# Patient Record
Sex: Female | Born: 1960 | Race: White | Hispanic: No | Marital: Married | Smoking: Former smoker
Health system: Southern US, Community
[De-identification: ages and names within clinical notes are randomized; demographics above are authoritative.]

## PROBLEM LIST (undated history)

## (undated) DIAGNOSIS — I10 Essential (primary) hypertension: Secondary | ICD-10-CM

## (undated) DIAGNOSIS — E781 Pure hyperglyceridemia: Secondary | ICD-10-CM

## (undated) DIAGNOSIS — M1990 Unspecified osteoarthritis, unspecified site: Secondary | ICD-10-CM

## (undated) DIAGNOSIS — G709 Myoneural disorder, unspecified: Secondary | ICD-10-CM

## (undated) DIAGNOSIS — K219 Gastro-esophageal reflux disease without esophagitis: Secondary | ICD-10-CM

## (undated) DIAGNOSIS — K649 Unspecified hemorrhoids: Secondary | ICD-10-CM

## (undated) DIAGNOSIS — R7989 Other specified abnormal findings of blood chemistry: Secondary | ICD-10-CM

## (undated) DIAGNOSIS — S62109A Fracture of unspecified carpal bone, unspecified wrist, initial encounter for closed fracture: Secondary | ICD-10-CM

## (undated) DIAGNOSIS — Z789 Other specified health status: Secondary | ICD-10-CM

## (undated) DIAGNOSIS — F909 Attention-deficit hyperactivity disorder, unspecified type: Secondary | ICD-10-CM

## (undated) DIAGNOSIS — M21619 Bunion of unspecified foot: Secondary | ICD-10-CM

## (undated) HISTORY — DX: Pure hyperglyceridemia: E78.1

## (undated) HISTORY — PX: TONSILLECTOMY: SUR1361

## (undated) HISTORY — DX: Unspecified hemorrhoids: K64.9

## (undated) HISTORY — PX: OTHER SURGICAL HISTORY: SHX169

## (undated) HISTORY — PX: RHINOPLASTY: SUR1284

## (undated) HISTORY — DX: Attention-deficit hyperactivity disorder, unspecified type: F90.9

## (undated) HISTORY — PX: SEPTOPLASTY: SUR1290

## (undated) HISTORY — DX: Fracture of unspecified carpal bone, unspecified wrist, initial encounter for closed fracture: S62.109A

## (undated) HISTORY — DX: Other specified abnormal findings of blood chemistry: R79.89

## (undated) HISTORY — PX: HYSTEROSCOPY: SHX211

---

## 2005-05-30 ENCOUNTER — Other Ambulatory Visit: Admission: RE | Admit: 2005-05-30 | Discharge: 2005-05-30 | Payer: Self-pay | Admitting: Family Medicine

## 2007-02-03 ENCOUNTER — Other Ambulatory Visit: Admission: RE | Admit: 2007-02-03 | Discharge: 2007-02-03 | Payer: Self-pay | Admitting: Obstetrics and Gynecology

## 2008-05-19 ENCOUNTER — Other Ambulatory Visit: Admission: RE | Admit: 2008-05-19 | Discharge: 2008-05-19 | Payer: Self-pay | Admitting: Obstetrics and Gynecology

## 2009-07-07 ENCOUNTER — Other Ambulatory Visit: Admission: RE | Admit: 2009-07-07 | Discharge: 2009-07-07 | Payer: Self-pay | Admitting: Obstetrics and Gynecology

## 2011-07-27 ENCOUNTER — Other Ambulatory Visit: Payer: Self-pay | Admitting: Obstetrics and Gynecology

## 2011-07-27 DIAGNOSIS — N644 Mastodynia: Secondary | ICD-10-CM

## 2011-08-03 ENCOUNTER — Other Ambulatory Visit: Payer: Self-pay | Admitting: Obstetrics and Gynecology

## 2011-08-03 ENCOUNTER — Ambulatory Visit
Admission: RE | Admit: 2011-08-03 | Discharge: 2011-08-03 | Disposition: A | Discharge status: Home / Self Care | Payer: BC Managed Care – PPO | Source: Ambulatory Visit | Attending: Obstetrics and Gynecology | Admitting: Obstetrics and Gynecology

## 2011-08-03 DIAGNOSIS — N644 Mastodynia: Secondary | ICD-10-CM

## 2011-12-25 ENCOUNTER — Other Ambulatory Visit (HOSPITAL_COMMUNITY)
Admission: RE | Admit: 2011-12-25 | Discharge: 2011-12-25 | Disposition: A | Discharge status: Home / Self Care | Payer: BC Managed Care – PPO | Source: Ambulatory Visit | Attending: Obstetrics and Gynecology | Admitting: Obstetrics and Gynecology

## 2011-12-25 ENCOUNTER — Other Ambulatory Visit: Payer: Self-pay | Admitting: Obstetrics and Gynecology

## 2011-12-25 DIAGNOSIS — Z01419 Encounter for gynecological examination (general) (routine) without abnormal findings: Secondary | ICD-10-CM | POA: Insufficient documentation

## 2011-12-25 DIAGNOSIS — Z1159 Encounter for screening for other viral diseases: Secondary | ICD-10-CM | POA: Insufficient documentation

## 2011-12-28 ENCOUNTER — Other Ambulatory Visit: Payer: Self-pay | Admitting: Obstetrics and Gynecology

## 2011-12-28 DIAGNOSIS — Z1231 Encounter for screening mammogram for malignant neoplasm of breast: Secondary | ICD-10-CM

## 2012-01-07 ENCOUNTER — Encounter (HOSPITAL_COMMUNITY): Payer: Self-pay | Admitting: Pharmacist

## 2012-01-07 ENCOUNTER — Encounter (HOSPITAL_COMMUNITY): Payer: Self-pay | Admitting: *Deleted

## 2012-01-14 ENCOUNTER — Other Ambulatory Visit: Payer: Self-pay | Admitting: Obstetrics and Gynecology

## 2012-01-16 ENCOUNTER — Ambulatory Visit (HOSPITAL_COMMUNITY): Payer: BC Managed Care – PPO | Admitting: Anesthesiology

## 2012-01-16 ENCOUNTER — Encounter (HOSPITAL_COMMUNITY): Payer: Self-pay | Admitting: *Deleted

## 2012-01-16 ENCOUNTER — Encounter (HOSPITAL_COMMUNITY)
Admission: RE | Disposition: A | Discharge status: Home / Self Care | Payer: Self-pay | Source: Ambulatory Visit | Attending: Obstetrics and Gynecology

## 2012-01-16 ENCOUNTER — Encounter (HOSPITAL_COMMUNITY): Payer: Self-pay | Admitting: Anesthesiology

## 2012-01-16 ENCOUNTER — Ambulatory Visit (HOSPITAL_COMMUNITY)
Admission: RE | Admit: 2012-01-16 | Discharge: 2012-01-16 | Disposition: A | Discharge status: Home / Self Care | Payer: BC Managed Care – PPO | Source: Ambulatory Visit | Attending: Obstetrics and Gynecology | Admitting: Obstetrics and Gynecology

## 2012-01-16 DIAGNOSIS — N920 Excessive and frequent menstruation with regular cycle: Secondary | ICD-10-CM | POA: Diagnosis present

## 2012-01-16 DIAGNOSIS — N926 Irregular menstruation, unspecified: Secondary | ICD-10-CM | POA: Insufficient documentation

## 2012-01-16 DIAGNOSIS — D250 Submucous leiomyoma of uterus: Secondary | ICD-10-CM | POA: Diagnosis present

## 2012-01-16 HISTORY — DX: Other specified health status: Z78.9

## 2012-01-16 LAB — CBC
HCT: 38.5 % (ref 36.0–46.0)
MCV: 85.6 fL (ref 78.0–100.0)
Platelets: 241 10*3/uL (ref 150–400)
RBC: 4.5 MIL/uL (ref 3.87–5.11)
WBC: 8.7 10*3/uL (ref 4.0–10.5)

## 2012-01-16 SURGERY — DILATATION & CURETTAGE/HYSTEROSCOPY WITH HYDROTHERMAL ABLATION
Anesthesia: General | Site: Vagina | Wound class: Clean Contaminated

## 2012-01-16 MED ORDER — HYDROCODONE-ACETAMINOPHEN 5-500 MG PO TABS: ORAL_TABLET | ORAL | Status: DC

## 2012-01-16 MED ORDER — ONDANSETRON HCL 4 MG/2ML IJ SOLN
INTRAMUSCULAR | Status: DC | PRN
  Administered 2012-01-16: 4 mg via INTRAVENOUS

## 2012-01-16 MED ORDER — FENTANYL CITRATE 0.05 MG/ML IJ SOLN
INTRAMUSCULAR | Status: AC
  Filled 2012-01-16: qty 2

## 2012-01-16 MED ORDER — KETOROLAC TROMETHAMINE 30 MG/ML IJ SOLN
INTRAMUSCULAR | Status: AC
  Filled 2012-01-16: qty 1

## 2012-01-16 MED ORDER — DEXAMETHASONE SODIUM PHOSPHATE 10 MG/ML IJ SOLN
INTRAMUSCULAR | Status: DC | PRN
  Administered 2012-01-16: 10 mg via INTRAVENOUS

## 2012-01-16 MED ORDER — MIDAZOLAM HCL 5 MG/5ML IJ SOLN
INTRAMUSCULAR | Status: DC | PRN
  Administered 2012-01-16: 2 mg via INTRAVENOUS

## 2012-01-16 MED ORDER — LACTATED RINGERS IV SOLN
INTRAVENOUS | Status: DC
  Administered 2012-01-16: 13:00:00 via INTRAVENOUS

## 2012-01-16 MED ORDER — LIDOCAINE HCL (CARDIAC) 20 MG/ML IV SOLN
INTRAVENOUS | Status: AC
  Filled 2012-01-16: qty 5

## 2012-01-16 MED ORDER — FENTANYL CITRATE 0.05 MG/ML IJ SOLN
INTRAMUSCULAR | Status: DC | PRN
  Administered 2012-01-16: 100 ug via INTRAVENOUS

## 2012-01-16 MED ORDER — ONDANSETRON HCL 4 MG/2ML IJ SOLN
INTRAMUSCULAR | Status: AC
  Filled 2012-01-16: qty 2

## 2012-01-16 MED ORDER — KETOROLAC TROMETHAMINE 30 MG/ML IJ SOLN
INTRAMUSCULAR | Status: DC | PRN
  Administered 2012-01-16: 30 mg via INTRAVENOUS

## 2012-01-16 MED ORDER — PROPOFOL 10 MG/ML IV EMUL
INTRAVENOUS | Status: AC
  Filled 2012-01-16: qty 20

## 2012-01-16 MED ORDER — SODIUM CHLORIDE 0.9 % IR SOLN
Status: DC | PRN
  Administered 2012-01-16: 3000 mL

## 2012-01-16 MED ORDER — DOXYCYCLINE HYCLATE 50 MG PO CAPS: 100.0000 mg | ORAL_CAPSULE | Freq: Every day | ORAL | Status: AC

## 2012-01-16 MED ORDER — PROPOFOL 10 MG/ML IV EMUL
INTRAVENOUS | Status: DC | PRN
  Administered 2012-01-16: 160 mg via INTRAVENOUS

## 2012-01-16 MED ORDER — EPHEDRINE 5 MG/ML INJ
INTRAVENOUS | Status: AC
  Filled 2012-01-16: qty 10

## 2012-01-16 MED ORDER — LIDOCAINE HCL (CARDIAC) 20 MG/ML IV SOLN
INTRAVENOUS | Status: DC | PRN
  Administered 2012-01-16: 50 mg via INTRAVENOUS

## 2012-01-16 MED ORDER — BUPIVACAINE HCL (PF) 0.25 % IJ SOLN
INTRAMUSCULAR | Status: DC | PRN
  Administered 2012-01-16: 20 mL

## 2012-01-16 MED ORDER — EPHEDRINE SULFATE 50 MG/ML IJ SOLN
INTRAMUSCULAR | Status: DC | PRN
  Administered 2012-01-16 (×3): 10 mg via INTRAVENOUS

## 2012-01-16 MED ORDER — BUPIVACAINE HCL (PF) 0.25 % IJ SOLN
INTRAMUSCULAR | Status: AC
  Filled 2012-01-16: qty 30

## 2012-01-16 MED ORDER — DEXAMETHASONE SODIUM PHOSPHATE 10 MG/ML IJ SOLN
INTRAMUSCULAR | Status: AC
  Filled 2012-01-16: qty 1

## 2012-01-16 MED ORDER — IBUPROFEN 600 MG PO TABS: 600.0000 mg | ORAL_TABLET | Freq: Four times a day (QID) | ORAL | Status: AC | PRN

## 2012-01-16 MED ORDER — FENTANYL CITRATE 0.05 MG/ML IJ SOLN: 25.0000 ug | INTRAMUSCULAR | Status: DC | PRN

## 2012-01-16 MED ORDER — MIDAZOLAM HCL 2 MG/2ML IJ SOLN
INTRAMUSCULAR | Status: AC
  Filled 2012-01-16: qty 2

## 2012-01-16 MED ORDER — SILVER NITRATE-POT NITRATE 75-25 % EX MISC
CUTANEOUS | Status: AC
  Filled 2012-01-16: qty 1

## 2012-01-16 SURGICAL SUPPLY — 17 items
CANISTER SUCTION 2500CC (MISCELLANEOUS) ×2 IMPLANT
CATH ROBINSON RED A/P 16FR (CATHETERS) ×2 IMPLANT
CATH THERMACHOICE III (CATHETERS) IMPLANT
CLOTH BEACON ORANGE TIMEOUT ST (SAFETY) ×2 IMPLANT
CONTAINER PREFILL 10% NBF 60ML (FORM) ×4 IMPLANT
ELECT REM PT RETURN 9FT ADLT (ELECTROSURGICAL)
ELECTRODE REM PT RTRN 9FT ADLT (ELECTROSURGICAL) IMPLANT
GLOVE BIOGEL M 6.5 STRL (GLOVE) ×2 IMPLANT
GLOVE BIOGEL PI IND STRL 6.5 (GLOVE) ×2 IMPLANT
GLOVE BIOGEL PI INDICATOR 6.5 (GLOVE) ×2
GOWN PREVENTION PLUS LG XLONG (DISPOSABLE) ×2 IMPLANT
GOWN STRL REIN XL XLG (GOWN DISPOSABLE) ×2 IMPLANT
PACK HYSTEROSCOPY LF (CUSTOM PROCEDURE TRAY) ×2 IMPLANT
SET GENESYS HTA PROCERVA (MISCELLANEOUS) ×2 IMPLANT
TOWEL OR 17X24 6PK STRL BLUE (TOWEL DISPOSABLE) ×4 IMPLANT
TUBING HYDROFLEX HYSTEROSCOPY (TUBING) ×2 IMPLANT
WATER STERILE IRR 1000ML POUR (IV SOLUTION) ×2 IMPLANT

## 2012-01-16 NOTE — H&P (Signed)
Date of Initial H&P: 12/31/2011  History reviewed, patient examined, no change in status, stable for surgery.

## 2012-01-16 NOTE — Transfer of Care (Signed)
Immediate Anesthesia Transfer of Care Note  Patient: Joyce Lewis  Procedure(s) Performed: Procedure(s) (LRB): DILATATION & CURETTAGE/HYSTEROSCOPY WITH HYDROTHERMAL ABLATION (N/A)  Patient Location: PACU  Anesthesia Type: General  Level of Consciousness: sedated  Airway & Oxygen Therapy: Patient Spontanous Breathing and Patient connected to nasal cannula oxygen  Post-op Assessment: Report given to PACU RN  Post vital signs: Reviewed and stable  Complications: No apparent anesthesia complications

## 2012-01-16 NOTE — Op Note (Signed)
01/16/2012  3:37 PM  PATIENT:  Joyce Lewis  51 y.o. female  PRE-OPERATIVE DIAGNOSIS: 1.  Irregular Menses 2 Menorrhagia   POST-OPERATIVE DIAGNOSIS: 1.  irregular menses 2. Menorrhagia   PROCEDURE:  Procedure(s) (LRB): DILATATION & CURETTAGE/HYSTEROSCOPY WITH HYDROTHERMAL ABLATION (N/A)  SURGEON:  Surgeon(s) and Role:    * Byron Tipping J. Richardson Dopp, MD - Primary  PHYSICIAN ASSISTANT: None   ASSISTANTS: none   ANESTHESIA:   general  EBL:  Total I/O In: 900 [I.V.:900] Out: 60 [Urine:50; Blood:10]  BLOOD ADMINISTERED:none  DRAINS: none   LOCAL MEDICATIONS USED:  MARCAINE     SPECIMEN:  Source of Specimen:  endometrial currettings  DISPOSITION OF SPECIMEN:  PATHOLOGY  COUNTS:  YES  TOURNIQUET:  * No tourniquets in log *  DICTATION: .Dragon Dictation  PLAN OF CARE: Discharge to home after PACU  PATIENT DISPOSITION:  PACU - hemodynamically stable.   Delay start of Pharmacological VTE agent (>24hrs) due to surgical blood loss or risk of bleeding: not applicable  Distention Medium : Normal saline ... Deficit 200 ml   Findings Proliferative appearing endometrium   Procedure: Patient was taken to the operating room where she was placed under general anesthesia. She was placed in the dorsal lithotomy position. She was prepped and draped in the usual sterile fashion. A speculum was placed into the vaginal vault. The anterior lip of the cervix was grasped with a single-tooth tenaculum. Quarter percent Marcaine was injected at the 4 and 8:00 positions of the cervix. The cervix was then sounded to 7.5 cm. The cervix was dilated to approximately 8  mm. HTA hysteroscope was inserted.  The patient had minimal distention of her cavity with the HTA hysteroscope therefore it was removed and a diagnostic scope was inserted with the finding noted above.  The hysteroscope was removed. Sharp curet was introduced and endometrial corrected curettings were obtained. The HTA hysteroscope was then  reinserted.  THe cavity assessment test was performed and passed.  There was no evidence of perforation. The HTA ablation was performed for 10 minutes at 90 Degrees C.... Hysteroscope was then removed. The single-tooth tenaculum was removed from the anterior lip of the cervix. Excellent hemostasis was noted. The speculum was removed from the patient's vagina. She was awakened from anesthesia taken to the room awake and in stable condition. Sponge lap and needle counts were correct x2.

## 2012-01-16 NOTE — Anesthesia Postprocedure Evaluation (Signed)
  Anesthesia Post-op Note  Patient: Joyce Lewis  Procedure(s) Performed: Procedure(s) (LRB): DILATATION & CURETTAGE/HYSTEROSCOPY WITH HYDROTHERMAL ABLATION (N/A)  Patient Location: PACU  Anesthesia Type: General  Level of Consciousness: awake, alert  and oriented  Airway and Oxygen Therapy: Patient Spontanous Breathing  Post-op Pain: none  Post-op Assessment: Post-op Vital signs reviewed, Patient's Cardiovascular Status Stable, Respiratory Function Stable, Patent Airway, No signs of Nausea or vomiting and Pain level controlled  Post-op Vital Signs: Reviewed and stable  Complications: No apparent anesthesia complications

## 2012-01-16 NOTE — Anesthesia Preprocedure Evaluation (Addendum)

## 2012-02-12 ENCOUNTER — Ambulatory Visit
Admission: RE | Admit: 2012-02-12 | Discharge: 2012-02-12 | Disposition: A | Discharge status: Home / Self Care | Payer: BC Managed Care – PPO | Source: Ambulatory Visit | Attending: Obstetrics and Gynecology | Admitting: Obstetrics and Gynecology

## 2012-02-12 DIAGNOSIS — Z1231 Encounter for screening mammogram for malignant neoplasm of breast: Secondary | ICD-10-CM

## 2013-01-05 ENCOUNTER — Other Ambulatory Visit: Payer: Self-pay

## 2013-01-05 DIAGNOSIS — Z1231 Encounter for screening mammogram for malignant neoplasm of breast: Secondary | ICD-10-CM

## 2013-02-17 ENCOUNTER — Ambulatory Visit
Admission: RE | Admit: 2013-02-17 | Discharge: 2013-02-17 | Disposition: A | Discharge status: Home / Self Care | Payer: BC Managed Care – PPO | Source: Ambulatory Visit

## 2013-02-17 DIAGNOSIS — Z1231 Encounter for screening mammogram for malignant neoplasm of breast: Secondary | ICD-10-CM

## 2013-02-24 ENCOUNTER — Other Ambulatory Visit: Payer: Self-pay | Admitting: Obstetrics and Gynecology

## 2013-02-24 ENCOUNTER — Other Ambulatory Visit (HOSPITAL_COMMUNITY)
Admission: RE | Admit: 2013-02-24 | Discharge: 2013-02-24 | Disposition: A | Discharge status: Home / Self Care | Payer: BC Managed Care – PPO | Source: Ambulatory Visit | Attending: Obstetrics and Gynecology | Admitting: Obstetrics and Gynecology

## 2013-02-24 DIAGNOSIS — Z01419 Encounter for gynecological examination (general) (routine) without abnormal findings: Secondary | ICD-10-CM | POA: Insufficient documentation

## 2013-05-15 ENCOUNTER — Telehealth: Payer: Self-pay | Admitting: Family Medicine

## 2013-05-15 ENCOUNTER — Ambulatory Visit (INDEPENDENT_AMBULATORY_CARE_PROVIDER_SITE_OTHER): Payer: BC Managed Care – PPO | Admitting: Family Medicine

## 2013-05-15 ENCOUNTER — Ambulatory Visit: Payer: BC Managed Care – PPO

## 2013-05-15 VITALS — BP 138/82 | HR 78 | Temp 98.2°F | Resp 18

## 2013-05-15 DIAGNOSIS — M25572 Pain in left ankle and joints of left foot: Secondary | ICD-10-CM

## 2013-05-15 DIAGNOSIS — M25579 Pain in unspecified ankle and joints of unspecified foot: Secondary | ICD-10-CM

## 2013-05-15 DIAGNOSIS — S93409A Sprain of unspecified ligament of unspecified ankle, initial encounter: Secondary | ICD-10-CM

## 2013-05-15 MED ORDER — HYDROCODONE-ACETAMINOPHEN 5-325 MG PO TABS: 1.0000 | ORAL_TABLET | Freq: Three times a day (TID) | ORAL | Status: DC | PRN

## 2013-05-15 NOTE — Patient Instructions (Signed)

## 2013-05-15 NOTE — Telephone Encounter (Signed)
Left message for patient to return call. Per [facility] radiologist reviewed XR and confirmed that she does have a fracture.  Stay in CAM walker and use crutches as planned. Recommend recheck in 1 week, sooner if worse

## 2013-05-15 NOTE — Progress Notes (Signed)
  Subjective:    Patient ID: Joyce Lewis, female    DOB: 1960-08-13, 52 y.o.   MRN: [id number]  HPI 52 year old female presents for evaluation of left ankle pain s/p and injury today at 4:00 p.m. She was getting up onto a stage and while pulling herself up rolled her ankle. Admits she head and felt a pop and since then has been unable to bear weight at all.  Admits to extreme pain around her medial malleolus.  Has some numbness as well as swelling and bruising. Is able to move her toes and plantar and dorsiflex.  No hx of previous injuries or surgeries to that foot.  Patient is otherwise doing well with no other concerns today.     Review of Systems  Constitutional: Negative for fever and chills.  Musculoskeletal: Positive for arthralgias, gait problem (unable to bear weight) and joint swelling.  Skin: Negative for wound.  Neurological: Positive for numbness. Negative for weakness.       Objective:   Physical Exam  Constitutional: She is oriented to person, place, and time. She appears well-developed and well-nourished.  HENT:  Head: Normocephalic and atraumatic.  Right Ear: External ear normal.  Left Ear: External ear normal.  Eyes: Conjunctivae are normal.  Neck: Normal range of motion.  Cardiovascular: Normal rate.   Pulmonary/Chest: Effort normal.  Musculoskeletal:       Left ankle: She exhibits swelling and ecchymosis. She exhibits normal range of motion, no deformity and normal pulse. Tenderness. Medial malleolus tenderness found. No lateral malleolus, no AITFL, no head of 5th metatarsal and no proximal fibula tenderness found. Achilles tendon normal.  Neurological: She is alert and oriented to person, place, and time.  Psychiatric: She has a normal mood and affect. Her behavior is normal. Judgment and thought content normal.      [facility] reading (PRIMARY) by  Dr. Patsy Lager as possible avulsion fracture of medial malleolus.      Assessment & Plan:  Pain in joint, ankle and  foot, left - Plan: DG Ankle Complete Left, HYDROcodone-acetaminophen (NORCO) 5-325 MG per tablet  Sprain of ankle, unspecified site  Placed in tall Cam and crutches. Instructed to be non weight bearing through the weekend. If pain persists into next week recommend recheck for repeat x-ray.  Norco 5/325 mg q8hours prn pain Ice for 20 minutes 2-3 times daily. Elevate as much as possible Return for recheck sooner if symptoms seem to be worsening, otherwise plan to re x-ray at end of next week if pain has not significantly improved.

## 2013-05-16 NOTE — Progress Notes (Signed)
Received OR report from her x-rays:  LEFT ANKLE COMPLETE - 3+ VIEW  COMPARISON: None.  FINDINGS:  Four views of left ankle submitted. Ankle mortise is preserved.  There is cortical irregularity at the tip of distal tibia medial  malleolus suspicious for subtle avulsion fracture. Soft tissue  swelling adjacent to medial malleolus.  IMPRESSION:  There is cortical irregularity at the tip of distal tibia medial  malleolus suspicious for subtle avulsion fracture. Soft tissue  swelling adjacent to medial malleolus.   Called her and gave this report.  She will come in to [facility] next week for a recheck and repeat films

## 2013-05-16 NOTE — Telephone Encounter (Signed)
Spoke with patient. She is in quite a bit of pain. Norco helped some but she did not sleep well last night. I told her she does have a fracture and will need to be non-weight bearing until follow up next Friday 11/14.  She is to come in sooner if symptoms seem to be getting worse. Patient understands and will try to rest as much as possible

## 2013-10-15 ENCOUNTER — Other Ambulatory Visit: Payer: Self-pay | Admitting: Gastroenterology

## 2013-10-19 ENCOUNTER — Ambulatory Visit: Payer: BC Managed Care – PPO

## 2013-10-19 ENCOUNTER — Ambulatory Visit (INDEPENDENT_AMBULATORY_CARE_PROVIDER_SITE_OTHER): Payer: BC Managed Care – PPO | Admitting: Family Medicine

## 2013-10-19 VITALS — BP 120/90 | HR 86 | Temp 98.0°F | Resp 16 | Ht 65.0 in | Wt 175.0 lb

## 2013-10-19 DIAGNOSIS — S5290XA Unspecified fracture of unspecified forearm, initial encounter for closed fracture: Secondary | ICD-10-CM

## 2013-10-19 DIAGNOSIS — M25539 Pain in unspecified wrist: Secondary | ICD-10-CM

## 2013-10-19 DIAGNOSIS — M25579 Pain in unspecified ankle and joints of unspecified foot: Secondary | ICD-10-CM

## 2013-10-19 MED ORDER — OXYCODONE-ACETAMINOPHEN 5-325 MG PO TABS: 1.0000 | ORAL_TABLET | Freq: Four times a day (QID) | ORAL | Status: DC | PRN

## 2013-10-19 MED ORDER — NALBUPHINE HCL 10 MG/ML IJ SOLN
10.0000 mg | Freq: Once | INTRAMUSCULAR | Status: AC
Start: 2013-10-19 — End: 2013-10-19
  Administered 2013-10-19: 10 mg via INTRAMUSCULAR

## 2013-10-19 NOTE — Progress Notes (Addendum)
.This chart was scribed for Joyce Haber, MD by Roxan Diesel, Scribe.  This patient was seen in [place] [facility] and the patient's care was started at 5:27 PM.  @[facility] @  Patient ID: Joyce Lewis MRN: [id number], DOB: 1961/01/02, 53 y.o. Date of Encounter: 10/19/2013, 5:26 PM  Primary Physician: Marcha Dutton, RN  Chief Complaint: Fall  HPI: 53 y.o. year old female with history below presents with a right wrist injury sustained in a fall 2 hours ago.  Pt states she was standing on a chair trying to reach for something when she slipped and fell to her right.  She caught herself with her right hand and twisted her right ankle.  Currently she complains of severe pain to her right wrist and moderate pain to her right ankle.  She denies head impact or LOC in the fall.  Works for the [employer]   Past Medical History  Diagnosis Date   No pertinent past medical history      Home Meds: Prior to Admission medications   Medication Sig Start Date End Date Taking? Authorizing Provider  HYDROcodone-acetaminophen (NORCO) 5-325 MG per tablet Take 1 tablet by mouth every 8 (eight) hours as needed. 05/15/13   Collene Leyden, PA-C  HYDROcodone-acetaminophen (VICODIN) 5-500 MG per tablet 1-2 every 6 hrs prn 01/16/12   Maeola Sarah. Landry Mellow, MD    Allergies: No Known Allergies  History   Social History   Marital Status: Married    Spouse Name: N/A    Number of Children: N/A   Years of Education: N/A   Occupational History   Not on file.   Social History Main Topics   Smoking status: Former Smoker    Types: Cigarettes   Smokeless tobacco: Not on file   Alcohol Use: 0.0 oz/week    1-2 drink(s) per week     Comment: rarely   Drug Use: No   Sexual Activity: Yes    Museum/gallery curator: Surgical   Other Topics Concern   Not on file   Social History Narrative   No narrative on file     Review of Systems: Constitutional: negative for chills, fever, night  sweats, weight changes, or fatigue  HEENT: negative for vision changes, hearing loss, congestion, rhinorrhea, ST, epistaxis, or sinus pressure Cardiovascular: negative for chest pain or palpitations Respiratory: negative for hemoptysis, wheezing, shortness of breath, or cough Abdominal: negative for abdominal pain, nausea, vomiting, diarrhea, or constipation Musculoskeletal: positive for right wrist pain and right ankle pain Dermatological: negative for rash Neurologic: negative for headache, dizziness, or syncope All other systems reviewed and are otherwise negative with the exception to those above and in the HPI.   Physical Exam: Blood pressure 120/90, pulse 86, temperature 98 F (36.7 C), temperature source Oral, resp. rate 16, height 5\' 5"  (1.651 m), weight 175 lb (79.379 kg), last menstrual period 10/10/2013, SpO2 98.00%., Body mass index is 29.12 kg/(m^2). General: Well developed, well nourished, in no acute distress. Head: Normocephalic, atraumatic, eyes without discharge, sclera non-icteric, nares are without discharge. Bilateral auditory canals clear, TM's are without perforation, pearly grey and translucent with reflective cone of light bilaterally. Oral cavity moist, posterior pharynx without exudate, erythema, peritonsillar abscess, or post nasal drip.  Neck: Supple. No thyromegaly. Full ROM. No lymphadenopathy. Lungs: Clear bilaterally to auscultation without wheezes, rales, or rhonchi. Breathing is unlabored. Heart: RRR with S1 S2. No murmurs, rubs, or gallops appreciated. Abdomen: Soft, non-tender, non-distended with normoactive bowel sounds. No hepatomegaly. No  rebound/guarding. No obvious abdominal masses. Msk:  Strength and tone normal for age. Extremities/Skin: Right wrist is grossly swollen without deformity. She's tender over the right radius and is unable to flex the wrist or pronate it. Neuro: Alert and oriented X 3.  Gait is antalgic with patient wearing left ankle  brace and having some tenderness with extreme range of motion of the right ankle. CNII-XII grossly in tact. Psych:  Responds to questions appropriately with a normal affect.   Labs: [facility] reading (PRIMARY) by  Dr. Joseph Art  negative right ankle, comminuted right radius fracture which is nondisplaced..    ASSESSMENT AND PLAN:  52 y.o. year old female with   Pain in joint, ankle and foot - Plan: DG Ankle Complete Right, oxyCODONE-acetaminophen (ROXICET) 5-325 MG per tablet  Wrist pain - Plan: DG Wrist Complete Right, oxyCODONE-acetaminophen (ROXICET) 5-325 MG per tablet  Follow up 6 days.  OOW until then  Signed, Joyce Haber, MD 10/19/2013 5:26 PM

## 2014-01-14 ENCOUNTER — Other Ambulatory Visit: Payer: Self-pay

## 2014-01-14 DIAGNOSIS — Z1231 Encounter for screening mammogram for malignant neoplasm of breast: Secondary | ICD-10-CM

## 2014-02-19 ENCOUNTER — Encounter (INDEPENDENT_AMBULATORY_CARE_PROVIDER_SITE_OTHER): Payer: Self-pay

## 2014-02-19 ENCOUNTER — Ambulatory Visit
Admission: RE | Admit: 2014-02-19 | Discharge: 2014-02-19 | Disposition: A | Discharge status: Home / Self Care | Payer: BC Managed Care – PPO | Source: Ambulatory Visit

## 2014-02-19 DIAGNOSIS — Z1231 Encounter for screening mammogram for malignant neoplasm of breast: Secondary | ICD-10-CM

## 2014-03-19 ENCOUNTER — Encounter: Payer: Self-pay | Admitting: *Deleted

## 2014-05-19 ENCOUNTER — Other Ambulatory Visit (HOSPITAL_COMMUNITY)
Admission: RE | Admit: 2014-05-19 | Discharge: 2014-05-19 | Disposition: A | Discharge status: Home / Self Care | Payer: BC Managed Care – PPO | Source: Ambulatory Visit | Attending: Obstetrics and Gynecology | Admitting: Obstetrics and Gynecology

## 2014-05-19 ENCOUNTER — Other Ambulatory Visit: Payer: Self-pay | Admitting: Obstetrics and Gynecology

## 2014-05-19 DIAGNOSIS — Z01419 Encounter for gynecological examination (general) (routine) without abnormal findings: Secondary | ICD-10-CM | POA: Insufficient documentation

## 2014-05-20 LAB — CYTOLOGY - PAP

## 2014-11-04 ENCOUNTER — Ambulatory Visit: Payer: BC Managed Care – PPO | Admitting: Cardiovascular Disease

## 2014-11-29 ENCOUNTER — Ambulatory Visit: Payer: BC Managed Care – PPO | Admitting: Licensed Clinical Social Worker

## 2014-12-01 ENCOUNTER — Ambulatory Visit: Payer: BC Managed Care – PPO | Admitting: Licensed Clinical Social Worker

## 2014-12-02 ENCOUNTER — Ambulatory Visit: Payer: BC Managed Care – PPO | Admitting: Cardiology

## 2014-12-10 ENCOUNTER — Encounter: Payer: Self-pay | Admitting: Cardiology

## 2014-12-17 ENCOUNTER — Ambulatory Visit (INDEPENDENT_AMBULATORY_CARE_PROVIDER_SITE_OTHER): Payer: BC Managed Care – PPO | Admitting: Licensed Clinical Social Worker

## 2014-12-17 DIAGNOSIS — F419 Anxiety disorder, unspecified: Secondary | ICD-10-CM

## 2014-12-31 ENCOUNTER — Ambulatory Visit (INDEPENDENT_AMBULATORY_CARE_PROVIDER_SITE_OTHER): Payer: BC Managed Care – PPO | Admitting: Licensed Clinical Social Worker

## 2014-12-31 DIAGNOSIS — F419 Anxiety disorder, unspecified: Secondary | ICD-10-CM | POA: Diagnosis not present

## 2015-01-13 ENCOUNTER — Ambulatory Visit: Payer: BC Managed Care – PPO | Admitting: Licensed Clinical Social Worker

## 2015-01-21 ENCOUNTER — Other Ambulatory Visit: Payer: Self-pay

## 2015-01-21 ENCOUNTER — Ambulatory Visit (INDEPENDENT_AMBULATORY_CARE_PROVIDER_SITE_OTHER): Payer: BC Managed Care – PPO | Admitting: Licensed Clinical Social Worker

## 2015-01-21 DIAGNOSIS — F419 Anxiety disorder, unspecified: Secondary | ICD-10-CM

## 2015-01-21 DIAGNOSIS — Z1231 Encounter for screening mammogram for malignant neoplasm of breast: Secondary | ICD-10-CM

## 2015-01-26 ENCOUNTER — Ambulatory Visit (INDEPENDENT_AMBULATORY_CARE_PROVIDER_SITE_OTHER): Payer: BC Managed Care – PPO | Admitting: Licensed Clinical Social Worker

## 2015-01-26 DIAGNOSIS — F419 Anxiety disorder, unspecified: Secondary | ICD-10-CM

## 2015-02-09 ENCOUNTER — Ambulatory Visit (INDEPENDENT_AMBULATORY_CARE_PROVIDER_SITE_OTHER): Payer: BC Managed Care – PPO | Admitting: Licensed Clinical Social Worker

## 2015-02-09 DIAGNOSIS — F419 Anxiety disorder, unspecified: Secondary | ICD-10-CM | POA: Diagnosis not present

## 2015-02-21 ENCOUNTER — Ambulatory Visit: Payer: BC Managed Care – PPO | Admitting: Licensed Clinical Social Worker

## 2015-02-21 ENCOUNTER — Ambulatory Visit
Admission: RE | Admit: 2015-02-21 | Discharge: 2015-02-21 | Disposition: A | Discharge status: Home / Self Care | Payer: BC Managed Care – PPO | Source: Ambulatory Visit

## 2015-02-21 DIAGNOSIS — Z1231 Encounter for screening mammogram for malignant neoplasm of breast: Secondary | ICD-10-CM

## 2015-08-15 ENCOUNTER — Encounter: Payer: Self-pay | Admitting: Cardiology

## 2015-08-15 ENCOUNTER — Ambulatory Visit (INDEPENDENT_AMBULATORY_CARE_PROVIDER_SITE_OTHER): Payer: BC Managed Care – PPO | Admitting: Cardiology

## 2015-08-15 VITALS — BP 116/80 | HR 88 | Ht 65.0 in | Wt 158.0 lb

## 2015-08-15 DIAGNOSIS — E785 Hyperlipidemia, unspecified: Secondary | ICD-10-CM

## 2015-08-15 DIAGNOSIS — R0789 Other chest pain: Secondary | ICD-10-CM | POA: Diagnosis not present

## 2015-08-15 DIAGNOSIS — I10 Essential (primary) hypertension: Secondary | ICD-10-CM | POA: Insufficient documentation

## 2015-08-15 NOTE — Progress Notes (Signed)
Cardiology Office Note    Date:  08/15/2015   ID:  Joyce Lewis, DOB 04/01/1961, MRN [id number]  PCP:  Anthoney Harada, MD  Cardiologist:   Candee Furbish, MD     History of Present Illness:  Joyce Lewis is a 55 y.o. female  Here for evaluation of tachycardia at the request of Dr.  Yaakov Guthrie.  It is been a while since we have seen each other. I saw her last on 01/02/10. She is a history of ADHD, anxiety, family history of MI. Heart attack in 76s. She's had a previous stress test which was normal on 12/03/2008. Exercise time was 9 minutes. No ectopy. No EKG changes. Normal blood pressure response. She was previously on red yeast rice. LDL 163. EKG from 09/27/14 shows sinus rhythm without any other abnormalities.  Chest pain walking in mall. Hard to breath when feels chest pain.  Felt like a sharp stabbing pain piercing through to her back. Does not recall having any relation to food. Sometimes this can be nonexertional. Sometimes it can occur during situational stress.  One time at night. Woke husband up.   Otherwise feeling well. 4 episodes last 2 years. Grandfather died MI 38. Other grandfather 32.      Past Medical History  Diagnosis Date  . No pertinent past medical history   . Chest pain   . High triglycerides   . Low serum vitamin D   . Hemorrhoids   . Broken wrist   . ADHD (attention deficit hyperactivity disorder)     Past Surgical History  Procedure Laterality Date  . Tonsillectomy    . Rhinoplasty    . Septoplasty    . Hysteroscopy        Current outpatient prescriptions:  .  amphetamine-dextroamphetamine (ADDERALL XR) 30 MG 24 hr capsule, Take 30 mg by mouth daily., Disp: , Rfl: 0 .  aspirin 81 MG tablet, Take 81 mg by mouth daily., Disp: , Rfl:  .  Calcium Carbonate-Vit D-Min (CALCIUM 1200) 1200-1000 MG-UNIT CHEW, Chew 1 tablet by mouth daily., Disp: , Rfl:  .  Cholecalciferol (VITAMIN D3 PO), Take 2 capsules by mouth daily., Disp: , Rfl:  .   Cyanocobalamin (VITAMIN B 12 PO), Take 1 capsule by mouth daily., Disp: , Rfl:  .  FIBER PO, Take 1 tablet by mouth daily., Disp: , Rfl:  .  losartan-hydrochlorothiazide (HYZAAR) 50-12.5 MG tablet, , Disp: , Rfl:  .  Omega-3 Fatty Acids (FISH OIL) 600 MG CAPS, Take 2 capsules by mouth daily., Disp: , Rfl:  .  Red Yeast Rice Extract (RED YEAST RICE PO), Take 1 tablet by mouth daily., Disp: , Rfl:   Allergies:   Review of patient's allergies indicates no known allergies.   Social History   Social History  . Marital Status: Married    Spouse Name: N/A  . Number of Children: N/A  . Years of Education: N/A   Social History Main Topics  . Smoking status: Former Smoker    Types: Cigarettes  . Smokeless tobacco: None  . Alcohol Use: 0.0 oz/week    1-2 drink(s) per week     Comment: rarely  . Drug Use: No  . Sexual Activity: Yes    Birth Control/ Protection: Surgical   Other Topics Concern  . None   Social History Narrative    Last cig 19.   Family History:  The patient's family history includes Cancer in her father; Depression in her brother and mother;  Diabetes in her maternal grandmother and mother; Heart disease in her father, paternal grandfather, and paternal grandmother; Hyperlipidemia in her brother and father; Hypertension in her brother and mother.   ROS:   Please see the history of present illness.    ROS occasional chest pain, chest pressure All other systems reviewed and are negative.   PHYSICAL EXAM:   VS:  BP 116/80 mmHg  Pulse 88  Ht 5\' 5"  (1.651 m)  Wt 158 lb (71.668 kg)  BMI 26.29 kg/m2   GEN: Well nourished, well developed, in no acute distress HEENT: normal Neck: no JVD, carotid bruits, or masses Cardiac: RRR; no murmurs, rubs, or gallops,no edema  Respiratory:  clear to auscultation bilaterally, normal work of breathing GI: soft, nontender, nondistended, + BS MS: no deformity or atrophy Skin: warm and dry, no rash Neuro:  Alert and Oriented x 3,  Strength and sensation are intact Psych: euthymic mood, full affect  Wt Readings from Last 3 Encounters:  08/15/15 158 lb (71.668 kg)  10/19/13 175 lb (79.379 kg)  01/16/12 165 lb (74.844 kg)      Studies/Labs Reviewed:   EKG:  EKG is ordered today.  The ekg ordered today demonstrates  08/15/15-sinus rhythm , no other abnormalities , no specific change from prior personally viewed   Exercise treadmill test 2010-low risk no ischemia  Recent Labs: No results found for requested labs within last 365 days.   Lipid Panel No results found for: CHOL, TRIG, HDL, CHOLHDL, VLDL, LDLCALC, LDLDIRECT  Additional studies/ records that were reviewed today include:   prior stress test, prior office notes, LDL 163, HDL 56    ASSESSMENT:    1. Atypical chest pain   2. Essential hypertension   3. Hyperlipidemia      PLAN:  In order of problems listed above:  1.  atypical chest pain- occasional chest discomfort 4 times over the last 2 years. One point was intense, sharp stabbing-like pain through to her back. Sometimes they can be brought on by stressful situations. She is concerned because of her family history of myocardial infarction. Back in 2010, she underwent an exercise treadmill test that was reassuring. We will do the same , order exercise table test. 2.  Hyperlipidemia-LDL 163. Continue to work on diet, exercise. If unable to reduce with diet and exercise alone, consider addition of low-dose statin therapy. She is worried about taking this medication or any new medications because of potential side effects. She states that she is very sensitive to medicines. Try a low dose if necessary. 3.  Essential hypertension-under great control with    Medication Adjustments/Labs and Tests Ordered: Current medicines are reviewed at length with the patient today.  Concerns regarding medicines are outlined above.  Medication changes, Labs and Tests ordered today are listed in the Patient  Instructions below. Patient Instructions  Medication Instructions:  The current medical regimen is effective;  continue present plan and medications.  Testing/Procedures: Your physician has requested that you have an exercise tolerance test. For further information please visit [email or web address]. Please also follow instruction sheet, as given.  Follow-Up: Follow up as needed with Dr Marlou Porch.  Thank you for choosing [facility]!!             Signed, Candee Furbish, MD  08/15/2015 12:02 PM    [facility] [place], [place], [place]  [address] Phone: [phone]; Fax: [phone]

## 2015-08-15 NOTE — Patient Instructions (Signed)
Medication Instructions:  The current medical regimen is effective;  continue present plan and medications.  Testing/Procedures: Your physician has requested that you have an exercise tolerance test. For further information please visit [email or web address]. Please also follow instruction sheet, as given.  Follow-Up: Follow up as needed with Dr Marlou Porch.  Thank you for choosing [facility]!!

## 2015-08-23 ENCOUNTER — Encounter: Payer: Self-pay | Admitting: Nurse Practitioner

## 2015-08-23 ENCOUNTER — Ambulatory Visit (INDEPENDENT_AMBULATORY_CARE_PROVIDER_SITE_OTHER): Payer: BC Managed Care – PPO

## 2015-08-23 DIAGNOSIS — I10 Essential (primary) hypertension: Secondary | ICD-10-CM

## 2015-08-23 DIAGNOSIS — R0789 Other chest pain: Secondary | ICD-10-CM

## 2015-08-24 LAB — EXERCISE TOLERANCE TEST
CHL CUP MPHR: 167 {beats}/min
CHL CUP STRESS STAGE 1 DBP: 84 mmHg
CHL CUP STRESS STAGE 1 GRADE: 0 %
CHL CUP STRESS STAGE 1 HR: 88 {beats}/min
CHL CUP STRESS STAGE 1 SBP: 138 mmHg
CHL CUP STRESS STAGE 1 SPEED: 0 mph
CHL CUP STRESS STAGE 2 GRADE: 0 %
CHL CUP STRESS STAGE 3 GRADE: 0 %
CHL CUP STRESS STAGE 3 HR: 89 {beats}/min
CHL CUP STRESS STAGE 3 SPEED: 1 mph
CHL CUP STRESS STAGE 4 DBP: 82 mmHg
CHL CUP STRESS STAGE 4 SBP: 151 mmHg
CHL CUP STRESS STAGE 5 GRADE: 12 %
CHL CUP STRESS STAGE 5 HR: 125 {beats}/min
CHL CUP STRESS STAGE 5 SBP: 157 mmHg
CHL CUP STRESS STAGE 6 DBP: 88 mmHg
CHL CUP STRESS STAGE 6 GRADE: 14 %
CHL CUP STRESS STAGE 6 SBP: 171 mmHg
CHL CUP STRESS STAGE 6 SPEED: 3.4 mph
CHL CUP STRESS STAGE 7 GRADE: 16 %
CHL CUP STRESS STAGE 7 HR: 150 {beats}/min
CHL CUP STRESS STAGE 8 SPEED: 1.5 mph
CHL CUP STRESS STAGE 9 GRADE: 0 %
CHL CUP STRESS STAGE 9 HR: 96 {beats}/min
CHL CUP STRESS STAGE 9 SBP: 152 mmHg
CHL CUP STRESS STAGE 9 SPEED: 0 mph
CHL RATE OF PERCEIVED EXERTION: 15
CSEPED: 9 min
Estimated workload: 10.8 METS
Exercise duration (sec): 30 s
Peak HR: 150 {beats}/min
Percent HR: 89 %
Percent of predicted max HR: 89 %
Rest HR: 81 {beats}/min
Stage 2 HR: 90 {beats}/min
Stage 2 Speed: 1 mph
Stage 4 Grade: 10 %
Stage 4 HR: 108 {beats}/min
Stage 4 Speed: 1.7 mph
Stage 5 DBP: 82 mmHg
Stage 5 Speed: 2.5 mph
Stage 6 HR: 146 {beats}/min
Stage 7 Speed: 4.2 mph
Stage 8 DBP: 83 mmHg
Stage 8 Grade: 0 %
Stage 8 HR: 131 {beats}/min
Stage 8 SBP: 168 mmHg
Stage 9 DBP: 87 mmHg

## 2016-01-20 ENCOUNTER — Other Ambulatory Visit: Payer: Self-pay | Admitting: Obstetrics and Gynecology

## 2016-01-20 DIAGNOSIS — Z1231 Encounter for screening mammogram for malignant neoplasm of breast: Secondary | ICD-10-CM

## 2016-02-22 ENCOUNTER — Ambulatory Visit
Admission: RE | Admit: 2016-02-22 | Discharge: 2016-02-22 | Disposition: A | Discharge status: Home / Self Care | Payer: BC Managed Care – PPO | Source: Ambulatory Visit | Attending: Obstetrics and Gynecology | Admitting: Obstetrics and Gynecology

## 2016-02-22 ENCOUNTER — Ambulatory Visit: Payer: Self-pay

## 2016-02-22 DIAGNOSIS — Z1231 Encounter for screening mammogram for malignant neoplasm of breast: Secondary | ICD-10-CM

## 2016-02-24 ENCOUNTER — Other Ambulatory Visit: Payer: Self-pay | Admitting: Gastroenterology

## 2016-02-24 DIAGNOSIS — R1310 Dysphagia, unspecified: Secondary | ICD-10-CM

## 2016-02-27 ENCOUNTER — Other Ambulatory Visit: Payer: Self-pay

## 2016-03-01 ENCOUNTER — Ambulatory Visit
Admission: RE | Admit: 2016-03-01 | Discharge: 2016-03-01 | Disposition: A | Discharge status: Home / Self Care | Payer: BC Managed Care – PPO | Source: Ambulatory Visit | Attending: Gastroenterology | Admitting: Gastroenterology

## 2016-03-01 DIAGNOSIS — R1310 Dysphagia, unspecified: Secondary | ICD-10-CM

## 2016-09-13 ENCOUNTER — Other Ambulatory Visit: Payer: Self-pay | Admitting: Nurse Practitioner

## 2016-09-13 ENCOUNTER — Other Ambulatory Visit (HOSPITAL_COMMUNITY)
Admission: RE | Admit: 2016-09-13 | Discharge: 2016-09-13 | Disposition: A | Discharge status: Home / Self Care | Payer: BC Managed Care – PPO | Source: Ambulatory Visit | Attending: Nurse Practitioner | Admitting: Nurse Practitioner

## 2016-09-13 DIAGNOSIS — Z01419 Encounter for gynecological examination (general) (routine) without abnormal findings: Secondary | ICD-10-CM | POA: Diagnosis present

## 2016-09-13 DIAGNOSIS — Z1151 Encounter for screening for human papillomavirus (HPV): Secondary | ICD-10-CM | POA: Diagnosis not present

## 2016-09-14 LAB — CYTOLOGY - PAP
DIAGNOSIS: NEGATIVE
HPV: NOT DETECTED

## 2017-01-11 ENCOUNTER — Other Ambulatory Visit: Payer: Self-pay | Admitting: Obstetrics and Gynecology

## 2017-01-11 DIAGNOSIS — Z1231 Encounter for screening mammogram for malignant neoplasm of breast: Secondary | ICD-10-CM

## 2017-02-20 DIAGNOSIS — N3946 Mixed incontinence: Secondary | ICD-10-CM | POA: Insufficient documentation

## 2017-02-20 DIAGNOSIS — N8111 Cystocele, midline: Secondary | ICD-10-CM | POA: Insufficient documentation

## 2017-02-22 ENCOUNTER — Ambulatory Visit
Admission: RE | Admit: 2017-02-22 | Discharge: 2017-02-22 | Disposition: A | Discharge status: Home / Self Care | Payer: BC Managed Care – PPO | Source: Ambulatory Visit | Attending: Obstetrics and Gynecology | Admitting: Obstetrics and Gynecology

## 2017-02-22 DIAGNOSIS — Z1231 Encounter for screening mammogram for malignant neoplasm of breast: Secondary | ICD-10-CM

## 2017-05-20 ENCOUNTER — Encounter (HOSPITAL_COMMUNITY): Payer: Self-pay

## 2017-05-20 NOTE — Progress Notes (Addendum)
BBC:WUGQ, Edwyna Shell, MD  Cardiologist: pt denies  EKG: 05/2017 -requested from [facility] test:08/2015 in EPIC  ECHO: pt denies ever  Cardiac Cath: pt denies ever  Chest x-ray: denies past year

## 2017-05-20 NOTE — Pre-Procedure Instructions (Signed)
Joyce Lewis  05/20/2017      [id number] [place], [place] - [address] [place] [address] Phone: [phone] Fax: [phone]    Your procedure is scheduled on May 23, 2017.  Report to [facility] Admitting at [phone] AM.  Call this number if you have problems the morning of surgery:  [phone]   Remember:  Do not eat food or drink liquids after midnight.  Take these medicines the morning of surgery with A SIP OF WATER (none).   Beginning now, STOP taking any Aspirin (unless otherwise instructed by your surgeon), Aleve, Naproxen, Ibuprofen, Motrin, Advil, Goody's, BC's, all herbal medications, fish oil, and all vitamins  Continue all other medications as instructed by your physician except follow the above medication instructions before surgery  Do not wear jewelry, make-up or nail polish.  Do not wear lotions, powders, or perfumes, or deoderant.  Do not shave 48 hours prior to surgery.    Do not bring valuables to the hospital.  [facility] is not responsible for any belongings or valuables.  Contacts, dentures or bridgework may not be worn into surgery.  Leave your suitcase in the car.  After surgery it may be brought to your room.  For patients admitted to the hospital, discharge time will be determined by your treatment team.  Patients discharged the day of surgery will not be allowed to drive home.   Special instructions:   [facility]- Preparing For Surgery  Before surgery, you can play an important role. Because skin is not sterile, your skin needs to be as free of germs as possible. You can reduce the number of germs on your skin by washing with CHG (chlorahexidine gluconate) Soap before surgery.  CHG is an antiseptic cleaner which kills germs and bonds with the skin to continue killing germs even after washing.  Please do not use if you have an allergy to CHG or antibacterial soaps. If your skin becomes  reddened/irritated stop using the CHG.  Do not shave (including legs and underarms) for at least 48 hours prior to first CHG shower. It is OK to shave your face.  Please follow these instructions carefully.   1. Shower the NIGHT BEFORE SURGERY and the MORNING OF SURGERY with CHG.   2. If you chose to wash your hair, wash your hair first as usual with your normal shampoo.  3. After you shampoo, rinse your hair and body thoroughly to remove the shampoo.  4. Use CHG as you would any other liquid soap. You can apply CHG directly to the skin and wash gently with a scrungie or a clean washcloth.   5. Apply the CHG Soap to your body ONLY FROM THE NECK DOWN.  Do not use on open wounds or open sores. Avoid contact with your eyes, ears, mouth and genitals (private parts). Wash Face and genitals (private parts)  with your normal soap.  6. Wash thoroughly, paying special attention to the area where your surgery will be performed.  7. Thoroughly rinse your body with warm water from the neck down.  8. DO NOT shower/wash with your normal soap after using and rinsing off the CHG Soap.  9. Pat yourself dry with a CLEAN TOWEL.  10. Wear CLEAN PAJAMAS to bed the night before surgery, wear comfortable clothes the morning of surgery  11. Place CLEAN SHEETS on your bed the night of your first shower and DO NOT SLEEP WITH PETS.  Day of Surgery: Do not apply any deodorants/lotions. Please wear clean clothes to the hospital/surgery center.     Please read over the following fact sheets that you were given. Pain Booklet, Coughing and Deep Breathing and Surgical Site Infection Prevention

## 2017-05-21 ENCOUNTER — Other Ambulatory Visit: Payer: Self-pay

## 2017-05-21 ENCOUNTER — Encounter (HOSPITAL_COMMUNITY)
Admission: RE | Admit: 2017-05-21 | Discharge: 2017-05-21 | Disposition: A | Discharge status: Home / Self Care | Payer: BC Managed Care – PPO | Source: Ambulatory Visit | Attending: Orthopedic Surgery | Admitting: Orthopedic Surgery

## 2017-05-21 ENCOUNTER — Encounter (HOSPITAL_COMMUNITY): Payer: Self-pay

## 2017-05-21 DIAGNOSIS — Z79899 Other long term (current) drug therapy: Secondary | ICD-10-CM | POA: Diagnosis not present

## 2017-05-21 DIAGNOSIS — Z87891 Personal history of nicotine dependence: Secondary | ICD-10-CM | POA: Diagnosis not present

## 2017-05-21 DIAGNOSIS — Z8262 Family history of osteoporosis: Secondary | ICD-10-CM | POA: Diagnosis not present

## 2017-05-21 DIAGNOSIS — M4856XA Collapsed vertebra, not elsewhere classified, lumbar region, initial encounter for fracture: Secondary | ICD-10-CM | POA: Diagnosis not present

## 2017-05-21 DIAGNOSIS — Z8249 Family history of ischemic heart disease and other diseases of the circulatory system: Secondary | ICD-10-CM | POA: Diagnosis not present

## 2017-05-21 DIAGNOSIS — M654 Radial styloid tenosynovitis [de Quervain]: Secondary | ICD-10-CM | POA: Diagnosis not present

## 2017-05-21 DIAGNOSIS — M5136 Other intervertebral disc degeneration, lumbar region: Secondary | ICD-10-CM | POA: Diagnosis not present

## 2017-05-21 DIAGNOSIS — M479 Spondylosis, unspecified: Secondary | ICD-10-CM | POA: Diagnosis not present

## 2017-05-21 DIAGNOSIS — Z836 Family history of other diseases of the respiratory system: Secondary | ICD-10-CM | POA: Diagnosis not present

## 2017-05-21 DIAGNOSIS — Z833 Family history of diabetes mellitus: Secondary | ICD-10-CM | POA: Diagnosis not present

## 2017-05-21 DIAGNOSIS — W228XXA Striking against or struck by other objects, initial encounter: Secondary | ICD-10-CM | POA: Diagnosis not present

## 2017-05-21 DIAGNOSIS — Z818 Family history of other mental and behavioral disorders: Secondary | ICD-10-CM | POA: Diagnosis not present

## 2017-05-21 HISTORY — DX: Essential (primary) hypertension: I10

## 2017-05-21 HISTORY — DX: Myoneural disorder, unspecified: G70.9

## 2017-05-21 HISTORY — DX: Unspecified osteoarthritis, unspecified site: M19.90

## 2017-05-21 HISTORY — DX: Gastro-esophageal reflux disease without esophagitis: K21.9

## 2017-05-21 LAB — SURGICAL PCR SCREEN
MRSA, PCR: NEGATIVE
Staphylococcus aureus: NEGATIVE

## 2017-05-21 LAB — CBC
HCT: 37.7 % (ref 36.0–46.0)
Hemoglobin: 13.3 g/dL (ref 12.0–15.0)
MCH: 30.6 pg (ref 26.0–34.0)
MCHC: 35.3 g/dL (ref 30.0–36.0)
MCV: 86.7 fL (ref 78.0–100.0)
PLATELETS: 241 10*3/uL (ref 150–400)
RBC: 4.35 MIL/uL (ref 3.87–5.11)
RDW: 12.4 % (ref 11.5–15.5)
WBC: 7.5 10*3/uL (ref 4.0–10.5)

## 2017-05-21 LAB — BASIC METABOLIC PANEL
Anion gap: 10 (ref 5–15)
BUN: 11 mg/dL (ref 6–20)
CALCIUM: 9.6 mg/dL (ref 8.9–10.3)
CHLORIDE: 104 mmol/L (ref 101–111)
CO2: 22 mmol/L (ref 22–32)
CREATININE: 0.58 mg/dL (ref 0.44–1.00)
GFR calc Af Amer: >60 mL/min (ref >60)
GFR calc non Af Amer: >60 mL/min (ref >60)
GLUCOSE: 94 mg/dL (ref 65–99)
Potassium: 3.4 mmol/L — ABNORMAL LOW (ref 3.5–5.1)
Sodium: 136 mmol/L (ref 135–145)

## 2017-05-22 ENCOUNTER — Encounter (HOSPITAL_COMMUNITY): Payer: Self-pay

## 2017-05-23 ENCOUNTER — Encounter (HOSPITAL_COMMUNITY)
Admission: RE | Disposition: A | Discharge status: Home / Self Care | Payer: Self-pay | Source: Ambulatory Visit | Attending: Orthopedic Surgery

## 2017-05-23 ENCOUNTER — Ambulatory Visit (HOSPITAL_COMMUNITY): Payer: BC Managed Care – PPO

## 2017-05-23 ENCOUNTER — Ambulatory Visit (HOSPITAL_COMMUNITY)
Admission: RE | Admit: 2017-05-23 | Discharge: 2017-05-23 | Disposition: A | Discharge status: Home / Self Care | Payer: BC Managed Care – PPO | Source: Ambulatory Visit | Attending: Orthopedic Surgery | Admitting: Orthopedic Surgery

## 2017-05-23 ENCOUNTER — Ambulatory Visit (HOSPITAL_COMMUNITY): Payer: BC Managed Care – PPO | Admitting: Anesthesiology

## 2017-05-23 ENCOUNTER — Encounter (HOSPITAL_COMMUNITY): Payer: Self-pay | Admitting: *Deleted

## 2017-05-23 ENCOUNTER — Ambulatory Visit (HOSPITAL_COMMUNITY): Payer: BC Managed Care – PPO | Admitting: Emergency Medicine

## 2017-05-23 DIAGNOSIS — W228XXA Striking against or struck by other objects, initial encounter: Secondary | ICD-10-CM | POA: Insufficient documentation

## 2017-05-23 DIAGNOSIS — M4856XA Collapsed vertebra, not elsewhere classified, lumbar region, initial encounter for fracture: Secondary | ICD-10-CM | POA: Insufficient documentation

## 2017-05-23 DIAGNOSIS — Z818 Family history of other mental and behavioral disorders: Secondary | ICD-10-CM | POA: Insufficient documentation

## 2017-05-23 DIAGNOSIS — Z87891 Personal history of nicotine dependence: Secondary | ICD-10-CM | POA: Insufficient documentation

## 2017-05-23 DIAGNOSIS — M654 Radial styloid tenosynovitis [de Quervain]: Secondary | ICD-10-CM | POA: Insufficient documentation

## 2017-05-23 DIAGNOSIS — M5136 Other intervertebral disc degeneration, lumbar region: Secondary | ICD-10-CM | POA: Insufficient documentation

## 2017-05-23 DIAGNOSIS — Z419 Encounter for procedure for purposes other than remedying health state, unspecified: Secondary | ICD-10-CM

## 2017-05-23 DIAGNOSIS — Z8249 Family history of ischemic heart disease and other diseases of the circulatory system: Secondary | ICD-10-CM | POA: Insufficient documentation

## 2017-05-23 DIAGNOSIS — Z79899 Other long term (current) drug therapy: Secondary | ICD-10-CM | POA: Insufficient documentation

## 2017-05-23 DIAGNOSIS — M479 Spondylosis, unspecified: Secondary | ICD-10-CM | POA: Insufficient documentation

## 2017-05-23 DIAGNOSIS — Z8262 Family history of osteoporosis: Secondary | ICD-10-CM | POA: Insufficient documentation

## 2017-05-23 DIAGNOSIS — Z833 Family history of diabetes mellitus: Secondary | ICD-10-CM | POA: Insufficient documentation

## 2017-05-23 DIAGNOSIS — Z836 Family history of other diseases of the respiratory system: Secondary | ICD-10-CM | POA: Insufficient documentation

## 2017-05-23 HISTORY — PX: KYPHOPLASTY: SHX5884

## 2017-05-23 SURGERY — KYPHOPLASTY
Anesthesia: Monitor Anesthesia Care | Site: Back

## 2017-05-23 MED ORDER — IOPAMIDOL (ISOVUE-300) INJECTION 61%
INTRAVENOUS | Status: AC
  Filled 2017-05-23: qty 50

## 2017-05-23 MED ORDER — ONDANSETRON HCL 4 MG/2ML IJ SOLN
INTRAMUSCULAR | Status: AC
  Filled 2017-05-23: qty 2

## 2017-05-23 MED ORDER — OXYCODONE HCL 5 MG PO TABS: 5.0000 mg | ORAL_TABLET | Freq: Once | ORAL | Status: DC | PRN

## 2017-05-23 MED ORDER — BUPIVACAINE-EPINEPHRINE (PF) 0.25% -1:200000 IJ SOLN
INTRAMUSCULAR | Status: AC
  Filled 2017-05-23: qty 30

## 2017-05-23 MED ORDER — BUPIVACAINE-EPINEPHRINE 0.25% -1:200000 IJ SOLN
INTRAMUSCULAR | Status: DC | PRN
  Administered 2017-05-23: 30 mL

## 2017-05-23 MED ORDER — LIDOCAINE HCL (CARDIAC) 20 MG/ML IV SOLN
INTRAVENOUS | Status: DC | PRN
  Administered 2017-05-23: 20 mg via INTRAVENOUS

## 2017-05-23 MED ORDER — BACLOFEN 10 MG PO TABS: 10.0000 mg | ORAL_TABLET | Freq: Three times a day (TID) | ORAL | 1 refills | Status: AC

## 2017-05-23 MED ORDER — IOPAMIDOL (ISOVUE-300) INJECTION 61%
INTRAVENOUS | Status: DC | PRN
  Administered 2017-05-23: 50 mL via INTRAVENOUS

## 2017-05-23 MED ORDER — 0.9 % SODIUM CHLORIDE (POUR BTL) OPTIME
TOPICAL | Status: DC | PRN
  Administered 2017-05-23: 500 mL

## 2017-05-23 MED ORDER — LACTATED RINGERS IV SOLN
INTRAVENOUS | Status: DC
  Administered 2017-05-23 (×2): via INTRAVENOUS

## 2017-05-23 MED ORDER — PROPOFOL 10 MG/ML IV BOLUS
INTRAVENOUS | Status: AC
  Filled 2017-05-23: qty 20

## 2017-05-23 MED ORDER — CEFAZOLIN SODIUM-DEXTROSE 2-4 GM/100ML-% IV SOLN
2.0000 g | INTRAVENOUS | Status: AC
  Administered 2017-05-23: 2 g via INTRAVENOUS
  Filled 2017-05-23: qty 100

## 2017-05-23 MED ORDER — ONDANSETRON HCL 4 MG/2ML IJ SOLN: 4.0000 mg | Freq: Once | INTRAMUSCULAR | Status: DC | PRN

## 2017-05-23 MED ORDER — ONDANSETRON HCL 4 MG/2ML IJ SOLN
INTRAMUSCULAR | Status: DC | PRN
  Administered 2017-05-23: 4 mg via INTRAVENOUS

## 2017-05-23 MED ORDER — PROPOFOL 500 MG/50ML IV EMUL
INTRAVENOUS | Status: DC | PRN
  Administered 2017-05-23: 75 ug/kg/min via INTRAVENOUS

## 2017-05-23 MED ORDER — MIDAZOLAM HCL 2 MG/2ML IJ SOLN
INTRAMUSCULAR | Status: AC
  Filled 2017-05-23: qty 2

## 2017-05-23 MED ORDER — DEXAMETHASONE SODIUM PHOSPHATE 10 MG/ML IJ SOLN
INTRAMUSCULAR | Status: AC
  Filled 2017-05-23: qty 1

## 2017-05-23 MED ORDER — ONDANSETRON 4 MG PO TBDP: 4.0000 mg | ORAL_TABLET | Freq: Three times a day (TID) | ORAL | 0 refills | Status: DC | PRN

## 2017-05-23 MED ORDER — FENTANYL CITRATE (PF) 250 MCG/5ML IJ SOLN
INTRAMUSCULAR | Status: AC
  Filled 2017-05-23: qty 5

## 2017-05-23 MED ORDER — HYDROMORPHONE HCL 1 MG/ML IJ SOLN: 0.2500 mg | INTRAMUSCULAR | Status: DC | PRN

## 2017-05-23 MED ORDER — HYDROCODONE-ACETAMINOPHEN 5-325 MG PO TABS: 1.0000 | ORAL_TABLET | Freq: Four times a day (QID) | ORAL | 0 refills | Status: DC | PRN

## 2017-05-23 MED ORDER — BUPIVACAINE LIPOSOME 1.3 % IJ SUSP
20.0000 mL | INTRAMUSCULAR | Status: AC
  Administered 2017-05-23: 20 mL
  Filled 2017-05-23: qty 20

## 2017-05-23 MED ORDER — FENTANYL CITRATE (PF) 100 MCG/2ML IJ SOLN
INTRAMUSCULAR | Status: DC | PRN
  Administered 2017-05-23 (×3): 50 ug via INTRAVENOUS

## 2017-05-23 MED ORDER — MIDAZOLAM HCL 5 MG/5ML IJ SOLN
INTRAMUSCULAR | Status: DC | PRN
  Administered 2017-05-23: 2 mg via INTRAVENOUS

## 2017-05-23 MED ORDER — LACTATED RINGERS IV SOLN: INTRAVENOUS | Status: DC

## 2017-05-23 MED ORDER — DEXAMETHASONE SODIUM PHOSPHATE 10 MG/ML IJ SOLN
INTRAMUSCULAR | Status: DC | PRN
  Administered 2017-05-23: 10 mg via INTRAVENOUS

## 2017-05-23 MED ORDER — OXYCODONE HCL 5 MG/5ML PO SOLN: 5.0000 mg | Freq: Once | ORAL | Status: DC | PRN

## 2017-05-23 SURGICAL SUPPLY — 47 items
BANDAGE ADH SHEER 1  50/CT (GAUZE/BANDAGES/DRESSINGS) ×2 IMPLANT
BLADE SURG 15 STRL LF DISP TIS (BLADE) ×1 IMPLANT
BLADE SURG 15 STRL SS (BLADE) ×1
BONE FILLER DEVICE STRL SZ3 (INSTRUMENTS) IMPLANT
COVER LIGHT HANDLE  DEROYL (MISCELLANEOUS) ×2 IMPLANT
COVER MAYO STAND STRL (DRAPES) ×2 IMPLANT
CURETTE WEDGE 8.5MM KYPHX (MISCELLANEOUS) ×2 IMPLANT
DRAPE C-ARM 42X72 X-RAY (DRAPES) ×4 IMPLANT
DRAPE HALF SHEET 40X57 (DRAPES) ×4 IMPLANT
DRAPE INCISE IOBAN 66X45 STRL (DRAPES) ×2 IMPLANT
DRAPE LAPAROTOMY T 102X78X121 (DRAPES) ×2 IMPLANT
DRAPE SURG 17X23 STRL (DRAPES) ×2 IMPLANT
DRAPE U-SHAPE 47X51 STRL (DRAPES) ×2 IMPLANT
DRSG AQUACEL AG ADV 3.5X10 (GAUZE/BANDAGES/DRESSINGS) ×2 IMPLANT
DURAPREP 26ML APPLICATOR (WOUND CARE) ×2 IMPLANT
ELECT PENCIL ROCKER SW 15FT (MISCELLANEOUS) ×2 IMPLANT
ELECT REM PT RETURN 9FT ADLT (ELECTROSURGICAL) ×2
ELECTRODE REM PT RTRN 9FT ADLT (ELECTROSURGICAL) ×1 IMPLANT
GAUZE SPONGE 4X4 16PLY XRAY LF (GAUZE/BANDAGES/DRESSINGS) ×2 IMPLANT
GLOVE BIO SURGEON STRL SZ 6.5 (GLOVE) ×2 IMPLANT
GLOVE BIOGEL PI IND STRL 6.5 (GLOVE) ×1 IMPLANT
GLOVE BIOGEL PI IND STRL 8.5 (GLOVE) ×1 IMPLANT
GLOVE BIOGEL PI INDICATOR 6.5 (GLOVE) ×1
GLOVE BIOGEL PI INDICATOR 8.5 (GLOVE) ×1
GLOVE SS BIOGEL STRL SZ 8.5 (GLOVE) ×2 IMPLANT
GLOVE SUPERSENSE BIOGEL SZ 8.5 (GLOVE) ×2
GOWN STRL REUS W/ TWL LRG LVL3 (GOWN DISPOSABLE) ×2 IMPLANT
GOWN STRL REUS W/TWL 2XL LVL3 (GOWN DISPOSABLE) ×2 IMPLANT
GOWN STRL REUS W/TWL LRG LVL3 (GOWN DISPOSABLE) ×2
KIT BASIN OR (CUSTOM PROCEDURE TRAY) ×2 IMPLANT
KIT BIPED VERT BALLN 20MM (Cement) ×2 IMPLANT
KIT ROOM TURNOVER OR (KITS) ×2 IMPLANT
NEEDLE HYPO 25X1 1.5 SAFETY (NEEDLE) ×2 IMPLANT
NEEDLE SPNL 18GX3.5 QUINCKE PK (NEEDLE) ×4 IMPLANT
NS IRRIG 1000ML POUR BTL (IV SOLUTION) ×2 IMPLANT
PACK SURGICAL SETUP 50X90 (CUSTOM PROCEDURE TRAY) ×2 IMPLANT
PACK UNIVERSAL I (CUSTOM PROCEDURE TRAY) ×2 IMPLANT
PAD ARMBOARD 7.5X6 YLW CONV (MISCELLANEOUS) ×4 IMPLANT
SURGIFLO W/THROMBIN 8M KIT (HEMOSTASIS) IMPLANT
SUT BONE WAX W31G (SUTURE) ×2 IMPLANT
SUT MNCRL AB 3-0 PS2 18 (SUTURE) ×2 IMPLANT
SYR CONTROL 10ML LL (SYRINGE) ×2 IMPLANT
TOWEL OR 17X24 6PK STRL BLUE (TOWEL DISPOSABLE) ×2 IMPLANT
TOWEL OR 17X26 10 PK STRL BLUE (TOWEL DISPOSABLE) ×2 IMPLANT
TRAY KYPHOPAK 15/3 ONESTEP 1ST (MISCELLANEOUS) IMPLANT
TRAY KYPHOPAK 20/3 ONESTEP 1ST (MISCELLANEOUS) IMPLANT
WATER STERILE IRR 1000ML POUR (IV SOLUTION) ×2 IMPLANT

## 2017-05-23 NOTE — Op Note (Signed)
Operative report.  Preoperative diagnosis: L1 compression fracture.  Postoperative diagnosis: Same.  Operative procedure.  L1 kyphoplasty.  Complications none.  First Environmental consultant.  [facility] PA.  Indications.  This is a very pleasant 56 year old woman who had significant back pain after a fall.  She is diagnosed with an L1 compression fracture.  Despite TLSO brace immobilization or pain and quality of life continued to suffer.  As a result we elected to proceed with kyphoplasty.  All appropriate risks benefits and alternatives were discussed with the patient and consent was obtained.  Operative note patient brought the operating room.  She was turned prone onto the operating room table.  When she was properly positioned IV sedation was provided back was prepped and draped in a standard fashion.  Timeout was taken confirming patient procedure and all other important data.  Fluoroscopic images were then obtained with 2 fluoroscopy machines one in the lateral the other in the AP plane.  We identified the appropriate starting position and infiltrated this area with quarter percent Marcaine with Exparel.  A small incision was made in the Jamshidi needle was advanced percutaneously down to the lateral aspect of the L1 pedicle.  I advanced the Jamshidi needle into the pedicle of L1.  Once I was nearing the medial wall of the pedicle I confirmed that I was just beyond the posterior wall of the vertebral body on the lateral view.  I then advanced into the vertebral body.  I repeated this exact same procedure on the contralateral side.  Once both pedicles were cannulated I placed the drill and then the inflatable bone tamps.  I inflated them.  I then deflated them and inserted the cement.  I had excellent fill with the cement.  It crossed the midline and supported the compression fracture.  There was no significant leak noted.  Once the cement hardened I removed the trochars to clean the skin.  The skin was then  closed with a 3-0 Monocryl simple stitch Dermabond and a Band-Aid.  Patient was transferred to the PACU without incident.  The end of the case all needle sponge counts were correct.  No adverse intraoperative events.

## 2017-05-23 NOTE — Anesthesia Postprocedure Evaluation (Signed)
Anesthesia Post Note  Patient: Joyce Lewis  Procedure(s) Performed: KYPHOPLASTY L1 (N/A Back)     Patient location during evaluation: PACU Anesthesia Type: MAC Level of consciousness: awake, awake and alert and oriented Pain management: pain level controlled Vital Signs Assessment: post-procedure vital signs reviewed and stable Respiratory status: spontaneous breathing, nonlabored ventilation and respiratory function stable Cardiovascular status: blood pressure returned to baseline Anesthetic complications: no    Last Vitals:  Vitals:   05/23/17 1403 05/23/17 1411  BP: 126/87   Pulse: 78 78  Resp: 18 18  Temp: 36.6 C   SpO2: 98% 98%    Last Pain:  Vitals:   05/23/17 1411  TempSrc:   PainSc: 0-No pain                 Darrelle Barrell COKER

## 2017-05-23 NOTE — Progress Notes (Signed)
Patient ate graham crackers and drank water without any issues. Denies nausea. Patient walked and voided prior to discharge.

## 2017-05-23 NOTE — Anesthesia Preprocedure Evaluation (Signed)
Anesthesia Evaluation  Patient identified by MRN, date of birth, ID band Patient awake    Reviewed: Allergy & Precautions, NPO status , Patient's Chart, lab work & pertinent test results  Airway Mallampati: II  TM Distance: >3 FB Neck ROM: Full    Dental  (+) Teeth Intact, Dental Advisory Given   Pulmonary former smoker,    breath sounds clear to auscultation       Cardiovascular hypertension,  Rhythm:Regular Rate:Normal     Neuro/Psych    GI/Hepatic   Endo/Other    Renal/GU      Musculoskeletal   Abdominal   Peds  Hematology   Anesthesia Other Findings   Reproductive/Obstetrics                             Anesthesia Physical Anesthesia Plan  ASA: II  Anesthesia Plan: General   Post-op Pain Management:    Induction: Intravenous  PONV Risk Score and Plan: 1 and Ondansetron and Dexamethasone  Airway Management Planned: Natural Airway and Simple Face Mask  Additional Equipment:   Intra-op Plan:   Post-operative Plan:   Informed Consent: I have reviewed the patients History and Physical, chart, labs and discussed the procedure including the risks, benefits and alternatives for the proposed anesthesia with the patient or authorized representative who has indicated his/her understanding and acceptance.   Dental advisory given  Plan Discussed with: CRNA and Anesthesiologist  Anesthesia Plan Comments:         Anesthesia Quick Evaluation

## 2017-05-23 NOTE — Progress Notes (Signed)
Per Dr. Rolena Infante patient can be discharged from PACU. No minimum stay needed. Patient instructed to call Dr. Rolena Infante' office in the morning.

## 2017-05-23 NOTE — Transfer of Care (Signed)
Immediate Anesthesia Transfer of Care Note  Patient: Joyce Lewis  Procedure(s) Performed: KYPHOPLASTY L1 (N/A Back)  Patient Location: PACU  Anesthesia Type:MAC  Level of Consciousness: awake, alert , oriented and sedated  Airway & Oxygen Therapy: Patient Spontanous Breathing and Patient connected to nasal cannula oxygen  Post-op Assessment: Report given to RN, Post -op Vital signs reviewed and stable and Patient moving all extremities  Post vital signs: Reviewed and stable  Last Vitals:  Vitals:   05/23/17 1031  BP: 137/90  Pulse: 71  Resp: 19  Temp: 36.7 C  SpO2: 99%    Last Pain:  Vitals:   05/23/17 1031  TempSrc: Oral         Complications: No apparent anesthesia complications

## 2017-05-23 NOTE — Discharge Instructions (Signed)
Balloon Kyphoplasty, Care After °Refer to this sheet in the next few weeks. These instructions provide you with information about caring for yourself after your procedure. Your health care provider may also give you more specific instructions. Your treatment has been planned according to current medical practices, but problems sometimes occur. Call your health care provider if you have any problems or questions after your procedure. °What can I expect after the procedure? °After your procedure, it is common to have back pain. °Follow these instructions at home: °Incision care °· Follow instructions from your health care provider about how to take care of your incisions. Make sure you: °? Wash your hands with soap and water before you change your bandage (dressing). If soap and water are not available, use hand sanitizer. °? Change your dressing as told by your health care provider. °? Leave stitches (sutures), skin glue, or adhesive strips in place. These skin closures may need to be in place for 2 weeks or longer. If adhesive strip edges start to loosen and curl up, you may trim the loose edges. Do not remove adhesive strips completely unless your health care provider tells you to do that. °· Check your incision area every day for signs of infection. Watch for: °? Redness, swelling, or pain. °? Fluid, blood, or pus. °· Keep your dressing dry until your health care provider says that it can be removed. °Activity ° °· Rest your back and avoid intense physical activity for as long as told by your health care provider. °· Return to your normal activities as told by your health care provider. Ask your health care provider what activities are safe for you. °· Do not lift anything that is heavier than 10 lb (4.5 kg). This is about the weight of a gallon of milk. You may need to avoid heavy lifting for several weeks. °General instructions °· Take over-the-counter and prescription medicines only as told by your health care  provider. °· If directed, apply ice to the painful area: °? Put ice in a plastic bag. °? Place a towel between your skin and the bag. °? Leave the ice on for 20 minutes, 2-3 times per day. °· Do not use tobacco products, including cigarettes, chewing tobacco, or e-cigarettes. If you need help quitting, ask your health care provider. °· Keep all follow-up visits as told by your health care provider. This is important. °Contact a health care provider if: °· You have a fever. °· You have redness, swelling, or pain at the site of your incisions. °· You have fluid, blood, or pus coming from your incisions. °· You have pain that gets worse or does not get better with medicine. °· You develop numbness or weakness in any part of your body. °Get help right away if: °· You have chest pain. °· You have difficulty breathing. °· You cannot move your legs. °· You cannot control your bladder or bowel movements. °· You suddenly become weak or numb on one side of your body. °· You become very confused. °· You have trouble speaking or understanding, or both. °This information is not intended to replace advice given to you by your health care provider. Make sure you discuss any questions you have with your health care provider. °Document Released: 03/16/2015 Document Revised: 12/01/2015 Document Reviewed: 10/18/2014 °Elsevier Interactive Patient Education © 2018 [employer] ° °

## 2017-05-23 NOTE — Anesthesia Procedure Notes (Signed)
Procedure Name: MAC Date/Time: 05/23/2017 12:00 PM Performed by: Scheryl Darter, CRNA Pre-anesthesia Checklist: Patient identified, Emergency Drugs available, Patient being monitored and Suction available Patient Re-evaluated:Patient Re-evaluated prior to induction Oxygen Delivery Method: Simple face mask Placement Confirmation: positive ETCO2

## 2017-05-23 NOTE — H&P (Signed)
History of Present Illness The patient is a 56 year old female who presents with back complaints. The patient reports pain ('I still cannot sleep"), tightness, stiffness, numbness (right foot) and tingling involving the L1 Compression Fracture which began 10 week(s) ago following a specific injury (03/08/17 "I fell in a twisted postition to catch the door and I fell down in a twisted position. I had another injury Monday night when I was in the recliner and it flipped over backwards trapping me under it"). The patient describes their pain as sharp ("when I sleep") rating it as 2-3 / 10 on a ten-point analog pain scale ("right now and in the morning, it is the worst when I sleep and turn over. It is a 8-9/10 when I sleep") and feels as if they are unchanged. The symptoms occur constantly. Symptoms are exacerbated by lifting, carrying, bending, flexion, extension, sitting, lying down and walking. The patient indicates that their symptoms are relieved with restricted activity, lying down and TLSO Brace.  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 father and brother Depression  mother Hypertension  grandmother fathers side Cancer  father Osteoporosis  grandmother mothers side Chronic Obstructive Lung Disease  mother Heart Disease   father and grandfather fathers side Diabetes Mellitus  mother  Social History Tobacco use  Former smoker. former smoker Marital status  married Illicit drug use  no Exercise  Exercises daily; does running / walking Pain Contract  no Drug/Alcohol Rehab (Currently)  no Current work status  working full time Alcohol use  current drinker; drinks wine; only occasionally per week Number of flights of stairs before winded  greater than 5 Living situation  live with spouse Drug/Alcohol Rehab (Previously)  no Children  2  Medication History Losartan Potassium (50MG  Tablet, Oral) Active. Amphetamine-Dextroamphet ER (30MG  Capsule ER 24HR, Oral) Active. (1 qd) Medications Reconciled   Note:she returns today for follow-up. Unfortunately she continues to have significant upper lumbar spine pain despite being in the TLSO brace. She has no focal neurological deficits in the lower extremity. No hip, knee, knee and ankle pain with joint range of motion. Normal gait pattern. In the brace she does have a feeling of better support, but still transitioning from sitting to standing or if she stands for long periods of time she generates significant lumbar spine pain. No shortness of breath, chest pain. Abdomen is soft and nontender, no rebound tenderness, no incontinence of bowel and bladder. Lungs clear to auscultation. Heart regular rate and rhythm, no rubs gallops murmurs. She is alert and oriented 3. intact peripheral pulses throughout upper and lower extremities. Compartments in the lower extremity soft and nontender.  X-rays from April 11, 2017 demonstrate an L1 compression fracture with loss of anterior height and loss of normal sagittal alignment of the lumbar spine. Local kyphosis 15.3  X-rays today demonstrate progression of the deformity at L1. L1 kyphosis is now 17.5 (despite TLSO  bracing).  MRI completed on April 16, 2017 demonstrates a mild unhealed what appears  to be benign fracture of L1 consistent with the compression deformity. She does have moderate to severe degenerative disc disease at L4-5 but no high-grade neural compromise. Mild degenerative disease at L5-S1. But there is significant facet arthrosis at L5-S1.  at this point time despite brace immobilization she continues to have significant pain that is limiting her quality of life, mobility, and ability to return to work. X-rays show that there has been fracture progression despite being in the brace.   At this point we spent a great deal of time discussing surgical intervention. We talked about the L1 kyphoplasty and she is watched the animated video of the procedure. Present today for the dictation is the patient and her sister all of their questions and concerns were addressed. We have discussed the risks of surgery: These include infection, bleeding, death, stroke, paralysis, cement leak, ongoing or worse pain, need for additional surgery, additional fractures requiring additional procedures, nerve damage, and CSF leak. the goal of surgery is to reduce her pain, and improve her quality of life. Because of the progression and the significant pain I will try and get this done as soon as possible. All of their questions were addressed. Surgical plan will be to do the L1 kyphoplasty with IV sedation and local anesthesia.  Patient's clinical exam is unchanged from her last office note.  Continues to have significant mid lumbar pain.  No focal neurological deficits on exam.  Given the failure of brace treatment we are moving forward with the L1 kyphoplasty.

## 2017-05-23 NOTE — Brief Op Note (Signed)
05/23/2017  1:36 PM  PATIENT:  Joyce Lewis  56 y.o. female  PRE-OPERATIVE DIAGNOSIS:  L1 Compression Fracture  POST-OPERATIVE DIAGNOSIS:  L1 Compression Fracture  PROCEDURE:  Procedure(s) with comments: KYPHOPLASTY L1 (N/A) - 90 mins  SURGEON:  Surgeon(s) and Role:    Melina Schools, MD - Primary  PHYSICIAN ASSISTANT:   ASSISTANTS: Carmen Mayo   ANESTHESIA:   IV sedation  EBL:  10 mL   BLOOD ADMINISTERED:none  DRAINS: none   LOCAL MEDICATIONS USED:  MARCAINE     SPECIMEN:  No Specimen  DISPOSITION OF SPECIMEN:  N/A  COUNTS:  YES  TOURNIQUET:  * No tourniquets in log *  DICTATION: .Dragon Dictation  PLAN OF CARE: Discharge to home after PACU  PATIENT DISPOSITION:  PACU - hemodynamically stable.

## 2017-05-24 ENCOUNTER — Encounter (HOSPITAL_COMMUNITY): Payer: Self-pay | Admitting: Orthopedic Surgery

## 2017-05-28 NOTE — Discharge Summary (Signed)
Physician Discharge Summary  Patient ID: Joyce Lewis MRN: [id number] DOB/AGE: 1960/09/03 56 y.o.  Admit date: 05/23/2017 Discharge date: 05/23/17  Admission Diagnoses:  L1 compression fracture  Discharge Diagnoses:  L1 compression fracture  Past Medical History:  Diagnosis Date  . ADHD (attention deficit hyperactivity disorder)   . Arthritis   . Broken wrist   . GERD (gastroesophageal reflux disease)   . Hemorrhoids   . High triglycerides   . Hypertension   . Low serum vitamin D   . Neuromuscular disorder ([place])   . No pertinent past medical history     Surgeries: Procedure(s): KYPHOPLASTY L1 on 05/23/2017   Consultants (if any):   Discharged Condition: Improved  Hospital Course: Joyce Lewis is an 56 y.o. female who was admitted 05/23/2017 with a diagnosis of L1 compression fracture and went to the operating room on 05/23/2017 and underwent the above named procedures.  Pt op pt reported mild pain.  Pt woke up expressing a desire to go home the same day.  Pt was observed in PACU.  She was able to eat w/o nausea, ambulate and urinate.    She was given perioperative antibiotics:  Anti-infectives (From admission, onward)   Start     Dose/Rate Route Frequency Ordered Stop   05/23/17 0949  ceFAZolin (ANCEF) IVPB 2g/100 mL premix     2 g 200 mL/hr over 30 Minutes Intravenous 30 min pre-op 05/23/17 0949 05/23/17 1210    .  She was given sequential compression devices, early ambulation, and TED for DVT prophylaxis.  She benefited maximally from the hospital stay and there were no complications.    Recent vital signs:  Vitals:   05/23/17 1403 05/23/17 1411  BP: 126/87   Pulse: 78 78  Resp: 18 18  Temp: 97.9 F (36.6 C)   SpO2: 98% 98%    Recent laboratory studies:  Lab Results  Component Value Date   HGB 13.3 05/21/2017   HGB 13.1 01/16/2012   Lab Results  Component Value Date   WBC 7.5 05/21/2017   PLT 241 05/21/2017   No results found for: INR Lab  Results  Component Value Date   NA 136 05/21/2017   K 3.4 (L) 05/21/2017   CL 104 05/21/2017   CO2 22 05/21/2017   BUN 11 05/21/2017   CREATININE 0.58 05/21/2017   GLUCOSE 94 05/21/2017    Discharge Medications:   Allergies as of 05/23/2017   No Known Allergies     Medication List    TAKE these medications   amphetamine-dextroamphetamine 20 MG 24 hr capsule Commonly known as:  ADDERALL XR Take 40 mg daily by mouth.   baclofen 10 MG tablet Commonly known as:  LIORESAL Take 1 tablet (10 mg total) 3 (three) times daily by mouth.   Biotin 5000 MCG Tabs Take 5,000 mcg daily by mouth.   CALCIUM PO Take 500 mg 2 (two) times daily by mouth.   Fish Oil 1200 MG Caps Take 2,400 mg daily by mouth.   HYDROcodone-acetaminophen 5-325 MG tablet Commonly known as:  NORCO/VICODIN Take 1-2 tablets every 6 (six) hours as needed by mouth for moderate pain.   ibuprofen 200 MG tablet Commonly known as:  ADVIL,MOTRIN Take 600 mg 2 (two) times daily as needed by mouth for moderate pain.   losartan-hydrochlorothiazide 50-12.5 MG tablet Commonly known as:  HYZAAR Take 1 tablet daily by mouth.   ondansetron 4 MG disintegrating tablet Commonly known as:  ZOFRAN ODT Take 1 tablet (4 mg total)  every 8 (eight) hours as needed by mouth for nausea or vomiting.   Red Yeast Rice 600 MG Caps Take 1,200 mg daily by mouth.   Vitamin D 2000 units Caps Take 4,000 Units daily by mouth.       Diagnostic Studies: Dg Lumbar Spine 2-3 Views  Result Date: 05/23/2017 CLINICAL DATA:  56 year old female undergoing lumbar kyphoplasty. EXAM: LUMBAR SPINE - 2-3 VIEW; DG C-ARM 61-120 MIN COMPARISON:  None. FLUOROSCOPY TIME:  2 minutes and 24 seconds FINDINGS: Two intraoperative fluoroscopic spot images of the thoracolumbar junction. There is compression and augmentation with bone cement of the first vertebral body without ribs, likely L1. There is a small volume of extraosseous bone cement along the right  lateral vertebra on the AP view. IMPRESSION: Vertebral body augmentation of L1. Electronically Signed   By: Genevie Ann M.D.   On: 05/23/2017 13:30   Dg C-arm 61-120 Min  Result Date: 05/23/2017 CLINICAL DATA:  56 year old female undergoing lumbar kyphoplasty. EXAM: LUMBAR SPINE - 2-3 VIEW; DG C-ARM 61-120 MIN COMPARISON:  None. FLUOROSCOPY TIME:  2 minutes and 24 seconds FINDINGS: Two intraoperative fluoroscopic spot images of the thoracolumbar junction. There is compression and augmentation with bone cement of the first vertebral body without ribs, likely L1. There is a small volume of extraosseous bone cement along the right lateral vertebra on the AP view. IMPRESSION: Vertebral body augmentation of L1. Electronically Signed   By: Genevie Ann M.D.   On: 05/23/2017 13:30    Disposition: 01-Home or Self Care Pt will present to clinic in 2 weeks Post op medication provided  Discharge Instructions    Incentive spirometry RT   Complete by:  As directed          Signed: Valinda Hoar 05/28/2017, 7:59 AM

## 2018-01-15 ENCOUNTER — Other Ambulatory Visit: Payer: Self-pay | Admitting: Obstetrics and Gynecology

## 2018-01-15 DIAGNOSIS — Z1231 Encounter for screening mammogram for malignant neoplasm of breast: Secondary | ICD-10-CM

## 2018-02-24 ENCOUNTER — Ambulatory Visit
Admission: RE | Admit: 2018-02-24 | Discharge: 2018-02-24 | Disposition: A | Discharge status: Home / Self Care | Payer: BC Managed Care – PPO | Source: Ambulatory Visit | Attending: Obstetrics and Gynecology | Admitting: Obstetrics and Gynecology

## 2018-02-24 DIAGNOSIS — Z1231 Encounter for screening mammogram for malignant neoplasm of breast: Secondary | ICD-10-CM

## 2019-01-19 ENCOUNTER — Other Ambulatory Visit: Payer: Self-pay | Admitting: Obstetrics and Gynecology

## 2019-01-19 DIAGNOSIS — Z1231 Encounter for screening mammogram for malignant neoplasm of breast: Secondary | ICD-10-CM

## 2019-03-04 ENCOUNTER — Ambulatory Visit: Payer: BC Managed Care – PPO

## 2019-03-23 ENCOUNTER — Other Ambulatory Visit: Payer: Self-pay

## 2019-03-23 DIAGNOSIS — Z20822 Contact with and (suspected) exposure to covid-19: Secondary | ICD-10-CM

## 2019-03-24 LAB — NOVEL CORONAVIRUS, NAA: SARS-CoV-2, NAA: NOT DETECTED

## 2019-03-31 ENCOUNTER — Other Ambulatory Visit: Payer: Self-pay

## 2019-03-31 DIAGNOSIS — Z20822 Contact with and (suspected) exposure to covid-19: Secondary | ICD-10-CM

## 2019-04-01 LAB — NOVEL CORONAVIRUS, NAA: SARS-CoV-2, NAA: NOT DETECTED

## 2019-04-06 ENCOUNTER — Ambulatory Visit
Admission: RE | Admit: 2019-04-06 | Discharge: 2019-04-06 | Disposition: A | Discharge status: Home / Self Care | Payer: BC Managed Care – PPO | Source: Ambulatory Visit | Attending: Obstetrics and Gynecology | Admitting: Obstetrics and Gynecology

## 2019-04-06 ENCOUNTER — Other Ambulatory Visit: Payer: Self-pay

## 2019-04-06 DIAGNOSIS — Z1231 Encounter for screening mammogram for malignant neoplasm of breast: Secondary | ICD-10-CM

## 2019-09-05 ENCOUNTER — Ambulatory Visit: Payer: BC Managed Care – PPO | Attending: Internal Medicine

## 2019-09-05 DIAGNOSIS — Z23 Encounter for immunization: Secondary | ICD-10-CM

## 2019-09-05 NOTE — Progress Notes (Signed)
   Covid-19 Vaccination Clinic  Name:  Joyce Lewis    MRN: [id number] DOB: [birth year]  09/05/2019  Joyce Lewis was observed post Covid-19 immunization for 15 minutes without incidence. She was provided with Vaccine Information Sheet and instruction to access the V-Safe system.   Joyce Lewis was instructed to call 911 with any severe reactions post vaccine: Marland Kitchen Difficulty breathing  . Swelling of your face and throat  . A fast heartbeat  . A bad rash all over your body  . Dizziness and weakness    Immunizations Administered    Name Date Dose VIS Date Route   Pfizer COVID-19 Vaccine 09/05/2019 12:31 PM 0.3 mL 06/19/2019 Intramuscular   Manufacturer: [place]   Lot: [id number]   [place]: [id number]

## 2019-09-26 ENCOUNTER — Ambulatory Visit: Payer: Self-pay

## 2019-09-26 ENCOUNTER — Ambulatory Visit: Payer: BC Managed Care – PPO | Attending: Internal Medicine

## 2019-09-26 DIAGNOSIS — Z23 Encounter for immunization: Secondary | ICD-10-CM

## 2019-09-26 NOTE — Progress Notes (Signed)
   Covid-19 Vaccination Clinic  Name:  Joyce Lewis    MRN: [id number] DOB: Mar 11, 1961  09/26/2019  [name] was observed post Covid-19 immunization for 15 minutes without incident. She was provided with Vaccine Information Sheet and instruction to access the V-Safe system.   [name] was instructed to call 911 with any severe reactions post vaccine: Marland Kitchen Difficulty breathing  . Swelling of face and throat  . A fast heartbeat  . A bad rash all over body  . Dizziness and weakness   Immunizations Administered    Name Date Dose VIS Date Route   Pfizer COVID-19 Vaccine 09/26/2019 12:53 PM 0.3 mL 06/19/2019 Intramuscular   Manufacturer: [place]   Lot: [id number]   [place]: [id number]

## 2019-10-06 ENCOUNTER — Ambulatory Visit: Payer: Self-pay

## 2020-02-15 ENCOUNTER — Other Ambulatory Visit: Payer: Self-pay | Admitting: Obstetrics and Gynecology

## 2020-02-15 DIAGNOSIS — Z1231 Encounter for screening mammogram for malignant neoplasm of breast: Secondary | ICD-10-CM

## 2020-05-03 ENCOUNTER — Ambulatory Visit: Payer: BC Managed Care – PPO

## 2020-07-04 ENCOUNTER — Ambulatory Visit: Payer: BC Managed Care – PPO

## 2020-08-15 ENCOUNTER — Ambulatory Visit
Admission: RE | Admit: 2020-08-15 | Discharge: 2020-08-15 | Disposition: A | Discharge status: Home / Self Care | Payer: Self-pay | Source: Ambulatory Visit | Attending: Obstetrics and Gynecology | Admitting: Obstetrics and Gynecology

## 2020-08-15 ENCOUNTER — Other Ambulatory Visit: Payer: Self-pay

## 2020-08-15 DIAGNOSIS — Z1231 Encounter for screening mammogram for malignant neoplasm of breast: Secondary | ICD-10-CM

## 2020-08-29 ENCOUNTER — Ambulatory Visit: Payer: BC Managed Care – PPO

## 2020-10-11 ENCOUNTER — Ambulatory Visit: Payer: BC Managed Care – PPO | Admitting: Podiatry

## 2020-11-01 ENCOUNTER — Ambulatory Visit: Payer: BC Managed Care – PPO | Admitting: Podiatry

## 2020-11-15 ENCOUNTER — Other Ambulatory Visit: Payer: Self-pay

## 2020-11-15 ENCOUNTER — Ambulatory Visit: Payer: BC Managed Care – PPO | Admitting: Podiatry

## 2021-07-17 ENCOUNTER — Other Ambulatory Visit: Payer: Self-pay | Admitting: Obstetrics and Gynecology

## 2021-07-17 DIAGNOSIS — Z1231 Encounter for screening mammogram for malignant neoplasm of breast: Secondary | ICD-10-CM

## 2021-08-28 ENCOUNTER — Ambulatory Visit
Admission: RE | Admit: 2021-08-28 | Discharge: 2021-08-28 | Disposition: A | Discharge status: Home / Self Care | Payer: BC Managed Care – PPO | Source: Ambulatory Visit | Attending: Obstetrics and Gynecology | Admitting: Obstetrics and Gynecology

## 2021-08-28 DIAGNOSIS — Z1231 Encounter for screening mammogram for malignant neoplasm of breast: Secondary | ICD-10-CM

## 2021-12-23 ENCOUNTER — Emergency Department (HOSPITAL_BASED_OUTPATIENT_CLINIC_OR_DEPARTMENT_OTHER): Payer: BC Managed Care – PPO | Admitting: Radiology

## 2021-12-23 ENCOUNTER — Emergency Department (HOSPITAL_BASED_OUTPATIENT_CLINIC_OR_DEPARTMENT_OTHER)
Admission: EM | Admit: 2021-12-23 | Discharge: 2021-12-23 | Disposition: A | Discharge status: Home / Self Care | Payer: BC Managed Care – PPO | Attending: Emergency Medicine | Admitting: Emergency Medicine

## 2021-12-23 ENCOUNTER — Encounter (HOSPITAL_BASED_OUTPATIENT_CLINIC_OR_DEPARTMENT_OTHER): Payer: Self-pay | Admitting: Emergency Medicine

## 2021-12-23 ENCOUNTER — Other Ambulatory Visit: Payer: Self-pay

## 2021-12-23 DIAGNOSIS — W19XXXA Unspecified fall, initial encounter: Secondary | ICD-10-CM | POA: Insufficient documentation

## 2021-12-23 DIAGNOSIS — M79631 Pain in right forearm: Secondary | ICD-10-CM | POA: Insufficient documentation

## 2021-12-23 DIAGNOSIS — M79621 Pain in right upper arm: Secondary | ICD-10-CM | POA: Insufficient documentation

## 2021-12-23 DIAGNOSIS — M25511 Pain in right shoulder: Secondary | ICD-10-CM | POA: Diagnosis present

## 2021-12-23 DIAGNOSIS — Z79899 Other long term (current) drug therapy: Secondary | ICD-10-CM | POA: Diagnosis not present

## 2021-12-23 MED ORDER — IBUPROFEN 200 MG PO TABS
600.0000 mg | ORAL_TABLET | Freq: Once | ORAL | Status: AC
  Administered 2021-12-23: 600 mg via ORAL
  Filled 2021-12-23: qty 1

## 2021-12-23 NOTE — Discharge Instructions (Addendum)
It was a pleasure taking care of you today!   Your xrays were unremarkable today. Attached is information for the on-call sports medicine doctor.  You may take over-the-counter 600 mg ibuprofen every 6 hours or 500 mg Tylenol every 6 hours as needed for pain.  You may apply ice to the affected area for up to 15 minutes at a time, ensure to place a barrier between your skin and the ice.  You may follow-up with your primary care provider as needed.  Return to the ED if you are experiencing increasing/worsening pain, trouble breathing, worsening symptoms.

## 2021-12-23 NOTE — ED Provider Notes (Signed)
[facility] EMERGENCY DEPT Provider Note   CSN: [id number] Arrival date & time: 12/23/21  1720     History  Chief Complaint  Patient presents with   Lytle Michaels    Joyce Lewis is a 61 y.o. female who presents to the emergency department complaining of fall onset prior to arrival.  Patient notes she was at the store and noticed that there was a wet floor to the area which caused her to slip and fall.  Patient has pain to the right shoulder, right upper arm, right forearm.  No meds tried prior to arrival.  Denies hitting her head, LOC, vomiting.   The history is provided by the patient. No language interpreter was used.       Home Medications Prior to Admission medications   Medication Sig Start Date End Date Taking? Authorizing Provider  amphetamine-dextroamphetamine (ADDERALL XR) 20 MG 24 hr capsule Take 40 mg daily by mouth. 04/24/17   [provider]  Biotin 5000 MCG TABS Take 5,000 mcg daily by mouth.    [provider]  CALCIUM PO Take 500 mg 2 (two) times daily by mouth.    [provider]  Cholecalciferol (VITAMIN D) 2000 units CAPS Take 4,000 Units daily by mouth.    [provider]  HYDROcodone-acetaminophen (NORCO/VICODIN) 5-325 MG tablet Take 1-2 tablets every 6 (six) hours as needed by mouth for moderate pain. 05/23/17   Mayo, Darla Lesches, PA-C  ibuprofen (ADVIL,MOTRIN) 200 MG tablet Take 600 mg 2 (two) times daily as needed by mouth for moderate pain.    [provider]  losartan-hydrochlorothiazide (HYZAAR) 50-12.5 MG tablet Take 1 tablet daily by mouth.  07/11/15   [provider]  Omega-3 Fatty Acids (FISH OIL) 1200 MG CAPS Take 2,400 mg daily by mouth.    [provider]  ondansetron (ZOFRAN ODT) 4 MG disintegrating tablet Take 1 tablet (4 mg total) every 8 (eight) hours as needed by mouth for nausea or vomiting. 05/23/17   Mayo, Carmen Christina, PA-C  Red Yeast Rice 600 MG CAPS Take 1,200 mg  daily by mouth.    [provider]      Allergies    Patient has no known allergies.    Review of Systems   Review of Systems  Gastrointestinal:  Negative for nausea and vomiting.  Musculoskeletal:  Positive for arthralgias. Negative for back pain and joint swelling.  Neurological:  Negative for syncope.  All other systems reviewed and are negative.   Physical Exam Updated Vital Signs BP (!) 147/85 (BP Location: Left Arm)   Pulse 83   Temp 97.8 F (36.6 C)   Resp 16   Ht 5' 4.5" (1.638 m)   Wt 77.1 kg   LMP 02/09/2015   SpO2 100%   BMI 28.73 kg/m  Physical Exam Vitals and nursing note reviewed.  Constitutional:      General: She is not in acute distress.    Appearance: She is not diaphoretic.  HENT:     Head: Normocephalic and atraumatic.     Mouth/Throat:     Pharynx: No oropharyngeal exudate.  Eyes:     General: No scleral icterus.    Conjunctiva/sclera: Conjunctivae normal.  Cardiovascular:     Rate and Rhythm: Normal rate and regular rhythm.     Pulses: Normal pulses.     Heart sounds: Normal heart sounds.  Pulmonary:     Effort: Pulmonary effort is normal. No respiratory distress.     Breath sounds: Normal  breath sounds. No wheezing.  Musculoskeletal:        General: Normal range of motion.     Cervical back: Normal range of motion and neck supple.     Comments: Grip strength 5/5 bilaterally.  Decreased active range of motion secondary to pain.  Full passive range of motion.  Sensation intact.  No spinal tenderness to palpation.  No tenderness to palpation noted to musculature of back.  Skin:    General: Skin is warm and dry.  Neurological:     Mental Status: She is alert.  Psychiatric:        Behavior: Behavior normal.     ED Results / Procedures / Treatments   Labs (all labs ordered are listed, but only abnormal results are displayed) Labs Reviewed - No data to display  EKG None  Radiology DG Forearm Right  Result Date:  12/23/2021 CLINICAL DATA:  Fall, right arm pain EXAM: RIGHT FOREARM - 2 VIEW COMPARISON:  None Available. FINDINGS: There is no evidence of fracture or other focal bone lesions. Soft tissues are unremarkable. IMPRESSION: Negative. Electronically Signed   By: Fidela Salisbury M.D.   On: 12/23/2021 19:02   DG Humerus Right  Result Date: 12/23/2021 CLINICAL DATA:  Fall, right arm pain EXAM: RIGHT HUMERUS - 2+ VIEW COMPARISON:  None Available. FINDINGS: There is no evidence of fracture or other focal bone lesions. Soft tissues are unremarkable. IMPRESSION: Negative. Electronically Signed   By: Fidela Salisbury M.D.   On: 12/23/2021 19:02   DG Shoulder Right  Result Date: 12/23/2021 CLINICAL DATA:  Fall, right shoulder pain EXAM: RIGHT SHOULDER - 2+ VIEW COMPARISON:  None Available. FINDINGS: Normal alignment. No acute fracture or dislocation. 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 Limited evaluation of the right hemithorax is unremarkable. 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 Electronically Signed   By: Fidela Salisbury M.D.   On: 12/23/2021 19:01    Procedures Procedures    Medications Ordered in ED Medications  ibuprofen (ADVIL) tablet 600 mg (600 mg Oral Given 12/23/21 2013)    ED Course/ Medical Decision Making/ A&P                            Medical Decision Making Amount and/or Complexity of Data Reviewed Radiology: ordered.   Pt with fall occurring PTA due to slipping on water at a store. Denies hitting their head, LOC, back pain, vomiting. Vital signs stable, patient afebrile, not tachycardic or hypoxic. On exam, patient with grip strength 5/5 bilaterally.  Decreased active range of motion secondary to pain.  Full passive range of motion.  Sensation intact.  No spinal tenderness to palpation.  No cardiovascular respiratory exam findings. Differential diagnosis includes fracture, dislocation, contusion.    Imaging: I ordered  imaging studies including right shoulder, right humerus, right forearm x-ray I independently visualized and interpreted imaging which showed: Negative for acute fractures or dislocations.  Before meals and glenohumeral degenerative arthritis noted on shoulder x-ray I agree with the radiologist interpretation  Medications:  I ordered medication including ibuprofen, ice for symptom management Reevaluation of the patient after these medicines and interventions, I reevaluated the patient and found that they have improved I have reviewed the patients home medicines and have made adjustments as needed    Disposition: Presentation suspicious for musculoskeletal pain status post fall today.  Doubt fracture or dislocation at this time. After consideration of the diagnostic results and the patients response to  treatment, I feel that the patient would benefit from Discharge home.  Supportive care and return precautions discussed with patient and significant other. Return precautions given to the patient including shortness of breath, worsening chest pain, or worsening headache. Appears safe for discharge at this time. Follow up as indicated in discharge paperwork.   This chart was dictated using voice recognition software, Dragon. Despite the best efforts of this provider to proofread and correct errors, errors may still occur which can change documentation meaning.   Final Clinical Impression(s) / ED Diagnoses Final diagnoses:  Fall, initial encounter    Rx / DC Orders ED Discharge Orders     None         Romell Wolden A, PA-C 12/23/21 2021    Fredia Sorrow, MD 01/06/22 [phone]

## 2021-12-23 NOTE — ED Notes (Signed)
Patient given discharge instructions, all questions answered. Patient in possession of all belongings, directed to the discharge area  

## 2021-12-23 NOTE — ED Triage Notes (Signed)
Pt from home with right shoulder and arm and hand pain after a fall today. She slipped while in a store in some water and fell on her right side. Pt states she feels tingling in her left hand. Pt has severe upper arm pain when moving her arm. Pt alert & oriented, nad noted.

## 2022-06-12 ENCOUNTER — Other Ambulatory Visit (HOSPITAL_COMMUNITY): Payer: Self-pay

## 2022-06-12 MED ORDER — WEGOVY 1 MG/0.5ML ~~LOC~~ SOAJ
1.0000 mg | SUBCUTANEOUS | 0 refills | Status: DC
  Filled 2022-06-12 – 2022-06-13 (×2): qty 2, 28d supply, fill #0

## 2022-06-13 ENCOUNTER — Other Ambulatory Visit (HOSPITAL_COMMUNITY): Payer: Self-pay

## 2022-06-23 ENCOUNTER — Other Ambulatory Visit (HOSPITAL_COMMUNITY): Payer: Self-pay

## 2022-07-30 ENCOUNTER — Other Ambulatory Visit: Payer: Self-pay | Admitting: Family Medicine

## 2022-07-30 DIAGNOSIS — Z1231 Encounter for screening mammogram for malignant neoplasm of breast: Secondary | ICD-10-CM

## 2022-09-17 ENCOUNTER — Ambulatory Visit
Admission: RE | Admit: 2022-09-17 | Discharge: 2022-09-17 | Disposition: A | Discharge status: Home / Self Care | Payer: BC Managed Care – PPO | Source: Ambulatory Visit | Attending: Family Medicine | Admitting: Family Medicine

## 2022-09-17 DIAGNOSIS — Z1231 Encounter for screening mammogram for malignant neoplasm of breast: Secondary | ICD-10-CM

## 2022-10-03 ENCOUNTER — Other Ambulatory Visit (HOSPITAL_COMMUNITY): Payer: Self-pay

## 2022-10-03 MED ORDER — WEGOVY 1 MG/0.5ML ~~LOC~~ SOAJ
1.0000 mg | SUBCUTANEOUS | 0 refills | Status: DC
  Filled 2022-10-03: qty 2, 28d supply, fill #0

## 2022-10-05 ENCOUNTER — Other Ambulatory Visit (HOSPITAL_COMMUNITY): Payer: Self-pay

## 2022-12-18 ENCOUNTER — Ambulatory Visit: Payer: BC Managed Care – PPO | Admitting: Podiatry

## 2022-12-18 ENCOUNTER — Ambulatory Visit (INDEPENDENT_AMBULATORY_CARE_PROVIDER_SITE_OTHER): Payer: BC Managed Care – PPO

## 2022-12-18 ENCOUNTER — Encounter: Payer: Self-pay | Admitting: Podiatry

## 2022-12-18 DIAGNOSIS — M2041 Other hammer toe(s) (acquired), right foot: Secondary | ICD-10-CM

## 2022-12-18 NOTE — Progress Notes (Signed)
Subjective:  Patient ID: Joyce Lewis, female    DOB: 1961-03-18,  MRN: [id number] HPI Chief Complaint  Patient presents with   Toe Pain    2nd toe right - hammertoe deformity x years, injury to the toe many years ago and never had it checked at the time, now toe is rubbing against shoes causing a corn   New Patient (Initial Visit)    62 y.o. female presents with the above complaint.   ROS: Denies fever chills nausea vomit muscle aches pains calf pain back pain chest pain shortness of breath.  Past Medical History:  Diagnosis Date   ADHD (attention deficit hyperactivity disorder)    Arthritis    Broken wrist    GERD (gastroesophageal reflux disease)    Hemorrhoids    High triglycerides    Hypertension    Low serum vitamin D    Neuromuscular disorder (HCC)    No pertinent past medical history    Past Surgical History:  Procedure Laterality Date   ablation     HYSTEROSCOPY     KYPHOPLASTY N/A 05/23/2017   Procedure: KYPHOPLASTY L1;  Surgeon: Venita Lick, MD;  Location: [facility] OR;  Service: Orthopedics;  Laterality: N/A;  90 mins   RHINOPLASTY     SEPTOPLASTY     TONSILLECTOMY      Current Outpatient Medications:    amphetamine-dextroamphetamine (ADDERALL XR) 20 MG 24 hr capsule, Take 40 mg daily by mouth., Disp: , Rfl: 0   CALCIUM PO, Take 500 mg 2 (two) times daily by mouth., Disp: , Rfl:    Cholecalciferol (VITAMIN D) 2000 units CAPS, Take 4,000 Units daily by mouth., Disp: , Rfl:    docusate sodium (COLACE) 100 MG capsule, Take 100 mg by mouth daily as needed., Disp: , Rfl:    hydrOXYzine (ATARAX) 25 MG tablet, Take 25 mg by mouth 2 (two) times daily as needed., Disp: , Rfl:    lamoTRIgine (LAMICTAL) 150 MG tablet, Take 150 mg by mouth daily., Disp: , Rfl:    losartan (COZAAR) 100 MG tablet, Take 100 mg by mouth daily., Disp: , Rfl:    Magnesium 400 MG CAPS, Take by mouth., Disp: , Rfl:    metFORMIN (GLUCOPHAGE-XR) 500 MG 24 hr tablet, SMARTSIG:1 Tablet(s) By Mouth  Every Evening, Disp: , Rfl:    OZEMPIC, 1 MG/DOSE, 4 MG/3ML SOPN, Inject into the skin., Disp: , Rfl:    pantoprazole (PROTONIX) 40 MG tablet, Take 40 mg by mouth daily., Disp: , Rfl:   No Known Allergies Review of Systems Objective:  There were no vitals filed for this visit.  General: Well developed, nourished, in no acute distress, alert and oriented x3   Dermatological: Skin is warm, dry and supple bilateral. Nails x 10 are well maintained; remaining integument appears unremarkable at this time. There are no open sores, no preulcerative lesions, no rash or signs of infection present.  Vascular: Dorsalis Pedis artery and Posterior Tibial artery pedal pulses are 2/4 bilateral with immedate capillary fill time. Pedal hair growth present. No varicosities and no lower extremity edema present bilateral.   Neruologic: Grossly intact via light touch bilateral. Vibratory intact via tuning fork bilateral. Protective threshold with Semmes Wienstein monofilament intact to all pedal sites bilateral. Patellar and Achilles deep tendon reflexes 2+ bilateral. No Babinski or clonus noted bilateral.   Musculoskeletal: No gross boney pedal deformities bilateral. No pain, crepitus, or limitation noted with foot and ankle range of motion bilateral. Muscular strength 5/5 in all groups tested  bilateral.  Severe hammertoe deformity rigid at the PIPJ and a flexor contracture.  She also has dorsal contracture at the level of the metatarsophalangeal joint with hallux valgus deformity.  Gait: Unassisted, Nonantalgic.    Radiographs:  Radiographs taken today demonstrate osseously mature individual with mild generalized demineralization of the bones.  She also has metatarsus adductus with what appears to be an increase in the first intermetatarsal angle consistent with bunion deformity.  She also has a rigid hammertoe deformity at the PIPJ secondary to osteoarthritic changes.  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     Joyce Lewis, [place]

## 2022-12-21 ENCOUNTER — Other Ambulatory Visit: Payer: Self-pay | Admitting: Family Medicine

## 2022-12-21 ENCOUNTER — Ambulatory Visit: Payer: BC Managed Care – PPO | Admitting: Podiatry

## 2022-12-21 DIAGNOSIS — Z0000 Encounter for general adult medical examination without abnormal findings: Secondary | ICD-10-CM

## 2023-07-01 ENCOUNTER — Ambulatory Visit (INDEPENDENT_AMBULATORY_CARE_PROVIDER_SITE_OTHER): Payer: BC Managed Care – PPO | Admitting: Otolaryngology

## 2023-07-01 ENCOUNTER — Encounter (INDEPENDENT_AMBULATORY_CARE_PROVIDER_SITE_OTHER): Payer: Self-pay

## 2023-07-01 VITALS — Ht 64.0 in | Wt 155.0 lb

## 2023-07-01 DIAGNOSIS — H9202 Otalgia, left ear: Secondary | ICD-10-CM | POA: Diagnosis not present

## 2023-07-01 DIAGNOSIS — M2669 Other specified disorders of temporomandibular joint: Secondary | ICD-10-CM

## 2023-07-01 MED ORDER — CIPROFLOXACIN-DEXAMETHASONE 0.3-0.1 % OT SUSP: 4.0000 [drp] | Freq: Two times a day (BID) | OTIC | 1 refills | Status: AC

## 2023-07-01 NOTE — Patient Instructions (Signed)
I have ordered an imaging study for you to complete prior to your next visit. Please call [facility] Scheduling at [phone] to schedule your imaging if you have not received a call within 24 hours. If you are unable to complete your imaging study prior to your next scheduled visit please call our office to let us know.

## 2023-07-01 NOTE — Progress Notes (Signed)
Dear Dr. Hart Rochester, Here is my assessment for our mutual patient, Joyce Lewis. Thank you for allowing me the opportunity to care for your patient. Please do not hesitate to contact me should you have any other questions. Sincerely, Dr. Jovita Kussmaul  Otolaryngology Clinic Note Referring provider: Dr. Hart Rochester HPI:  Joyce Lewis is a 62 y.o. female kindly referred by Dr. Hart Rochester for evaluation of left ear pain.   Patient reports: Started at least 4 months ago, no antecedent event. She saw her PCP, prescribed augmentin and it did not help, so referred to ENT. She reports that the pain is still persistent. Comes and goes. Keeps her up at night. Most of the time it is a dull pain, but sometimes sharp. Pain radiates to the head and also gets vertex headaches. She describes the pain more so as "behind the ear" rather than "in the ear." She reports she has "never had problems with the ears."  She denies any nasal symptoms. 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 Sees dentist regularly. No neck pain. No cervical spine pain. No other ENT symptoms. She has tried advil, maybe a little bit. Patient denies: fullness, vertigo, drainage, tinnitus, change in hearing Patient additionally denies: deep pain in ear canal, eustachian tube symptoms such as popping, crackling, sensitivity to pressure changes Patient also denies barotrauma, vestibular suppressant use, ototoxic medication use Prior ear surgery: no  H&N Surgery: no Personal or FHx of bleeding dz or anesthesia difficulty: no  AP/AC: no  PMHx: Pre-DM, GERD, HTN  Tobacco: never.. Occupation: Works at [employer]. Lives in [place], [place]  Independent Review of Additional Tests or Records:  Referral notes Joyce Lewis 05/2023) reviewed and uploaded, available in chart - L AOM/Otalgia, Rx Augmentin; no improvement, referred to ENT  PMH/Meds/All/SocHx/FamHx/ROS:   Past Medical History:  Diagnosis Date   ADHD (attention deficit hyperactivity  disorder)    Arthritis    Broken wrist    GERD (gastroesophageal reflux disease)    Hemorrhoids    High triglycerides    Hypertension    Low serum vitamin D    Neuromuscular disorder (HCC)    No pertinent past medical history      Past Surgical History:  Procedure Laterality Date   ablation     HYSTEROSCOPY     KYPHOPLASTY N/A 05/23/2017   Procedure: KYPHOPLASTY L1;  Surgeon: Venita Lick, MD;  Location: [facility] OR;  Service: Orthopedics;  Laterality: N/A;  90 mins   RHINOPLASTY     SEPTOPLASTY     TONSILLECTOMY      Family History  Problem Relation Age of Onset   Diabetes Mother    Hypertension Mother    Depression Mother    Cancer Father        Esophageal   Heart disease Father    Hyperlipidemia Father    Breast cancer Maternal Aunt    Diabetes Maternal Grandmother    Heart disease Paternal Grandmother    Heart disease Paternal Grandfather    Hypertension Brother    Hyperlipidemia Brother    Depression Brother      Social Connections: Not on file      Current Outpatient Medications:    amphetamine-dextroamphetamine (ADDERALL XR) 20 MG 24 hr capsule, Take 40 mg daily by mouth., Disp: , Rfl: 0   CALCIUM PO, Take 500 mg 2 (two) times daily by mouth., Disp: , Rfl:    Cholecalciferol (VITAMIN D) 2000 units CAPS, Take 4,000 Units daily by mouth., Disp: , Rfl:  ciprofloxacin-dexamethasone (CIPRODEX) OTIC suspension, Place 4 drops into the left ear 2 (two) times daily for 7 days., Disp: 7.5 mL, Rfl: 1   docusate sodium (COLACE) 100 MG capsule, Take 100 mg by mouth daily as needed., Disp: , Rfl:    hydrOXYzine (ATARAX) 25 MG tablet, Take 25 mg by mouth 2 (two) times daily as needed., Disp: , Rfl:    lamoTRIgine (LAMICTAL) 150 MG tablet, Take 150 mg by mouth daily., Disp: , Rfl:    losartan (COZAAR) 100 MG tablet, Take 100 mg by mouth daily., Disp: , Rfl:    Magnesium 400 MG CAPS, Take by mouth., Disp: , Rfl:    metFORMIN (GLUCOPHAGE-XR) 500 MG 24 hr tablet, SMARTSIG:1  Tablet(s) By Mouth Every Evening, Disp: , Rfl:    pantoprazole (PROTONIX) 40 MG tablet, Take 40 mg by mouth daily., Disp: , Rfl:    OZEMPIC, 1 MG/DOSE, 4 MG/3ML SOPN, Inject into the skin. (Patient not taking: Reported on 07/01/2023), Disp: , Rfl:    Physical Exam:   Ht 5\' 4"  (1.626 m)   Wt 155 lb (70.3 kg)   LMP 02/09/2015   BMI 26.61 kg/m   Salient findings:  CN II-XII intact  Bilateral EAC clear and TM intact with well pneumatized middle ear spaces; minimal eczematoid change b/l OE Weber 512: midline Rinne 512: AC > BC b/l  Anterior rhinoscopy: Septum relatively midline; bilateral inferior turbinates without significant hypertrophy No lesions of oral cavity/oropharynx; dentition fair; mild crepitus left TMJ No obviously palpable neck masses/lymphadenopathy/thyromegaly No respiratory distress or stridor  Seprately Identifiable Procedures:  None  Impression & Plans:  Joyce Lewis is a 62 y.o. female with:  1. Unilateral otalgia, left   2. TMJ crepitus    No other ear symptoms and this has not responded to antibiotics. She does have mild crepitus of left TMJ and bruxism with otherwise remarkable ear exam. As such, we discussed DDX which includes referred pain (dental, TMJ, C-spine), or primary ear pathology or an occult mass. She has minimal eczematoid change as well so we will treat her for that, in case that helps. We also discussed options, and I think given persistence despite abx, we will get a CT to rule out occult pathology  We've discussed options as above and will start: Warm compresses x4 weeks Soft diet x4 weeks Ciprodex drops 4 drops BID x7d R/o occult pathology with CT Neck F/u in 6 weeks with audio   See below regarding exact medications prescribed this encounter including dosages and route: Meds ordered this encounter  Medications   ciprofloxacin-dexamethasone (CIPRODEX) OTIC suspension    Sig: Place 4 drops into the left ear 2 (two) times daily for 7 days.     Dispense:  7.5 mL    Refill:  1      Thank you for allowing me the opportunity to care for your patient. Please do not hesitate to contact me should you have any other questions.  Sincerely, Jovita Kussmaul, MD Otolarynoglogist (ENT), [facility] Phone: [phone] Fax: [phone]  07/01/2023, 8:33 AM   MDM:  Level 4 [phone] Complexity/Problems addressed: new undiagnosed problem, unknown prognosis needing further diagnostics Data complexity: low - independent review of notes - Morbidity: mod  - Prescription Drug prescribed or managed: yes

## 2023-07-11 ENCOUNTER — Ambulatory Visit (HOSPITAL_COMMUNITY): Payer: Self-pay

## 2023-07-11 ENCOUNTER — Encounter (HOSPITAL_COMMUNITY): Payer: Self-pay

## 2023-08-05 ENCOUNTER — Ambulatory Visit (INDEPENDENT_AMBULATORY_CARE_PROVIDER_SITE_OTHER): Payer: BC Managed Care – PPO

## 2023-08-05 ENCOUNTER — Ambulatory Visit: Payer: 59 | Admitting: Podiatry

## 2023-08-05 ENCOUNTER — Encounter: Payer: Self-pay | Admitting: Podiatry

## 2023-08-05 DIAGNOSIS — M2041 Other hammer toe(s) (acquired), right foot: Secondary | ICD-10-CM | POA: Diagnosis not present

## 2023-08-05 NOTE — Progress Notes (Signed)
She presents today 63 year old female for a continue painful first metatarsal and second metatarsal phalangeal joint area.  The majority of her pain is related to a rigid toe which she had fractured many years ago.  She relates that there has been no changes but she would like to go ahead and see about having surgical intervention on this sometime in June.  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  No changes in her past medical history medications allergies surgeries or social history.  Objective: Vital signs are stable she is alert and oriented x 3.  Pulses are palpable to the right foot.  Neurologic sensorium is intact severe rigid hammertoe deformity with a dorsal clavus at the PIPJ second digit right foot mild tailor's bunion deformity and mild hallux valgus deformity she got a full range of motion at the first metatarsophalangeal joint but painful limited range of motion at the second metatarsal phalangeal joint.  Radiographs reviewed today do demonstrate significant metatarsus adductus resulting in her bunion deformity which is resulting in her hammertoe deformity second right as well.  Though she does not have a very high first intermetatarsal angle it is causing the second toe deformity.  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  Plan: Discussed etiology pathology conservative surgical therapies at this point I feel that she needs to follow-up with Dr. Lilian Kapur for TMT fusions and correction of the angular deformities in relation to the rear foot.  She will also need a hammertoe repair second right and her bunion deformity should correct.  Follow-up with Dr. Lilian Kapur is soon as possible.

## 2023-08-20 ENCOUNTER — Encounter: Payer: Self-pay | Admitting: Podiatry

## 2023-08-20 ENCOUNTER — Ambulatory Visit: Payer: 59 | Admitting: Podiatry

## 2023-08-20 DIAGNOSIS — Q66221 Congenital metatarsus adductus, right foot: Secondary | ICD-10-CM

## 2023-08-20 DIAGNOSIS — M21611 Bunion of right foot: Secondary | ICD-10-CM

## 2023-08-20 DIAGNOSIS — E559 Vitamin D deficiency, unspecified: Secondary | ICD-10-CM

## 2023-08-20 DIAGNOSIS — M2041 Other hammer toe(s) (acquired), right foot: Secondary | ICD-10-CM | POA: Diagnosis not present

## 2023-08-20 DIAGNOSIS — M2011 Hallux valgus (acquired), right foot: Secondary | ICD-10-CM | POA: Diagnosis not present

## 2023-08-21 ENCOUNTER — Telehealth: Payer: Self-pay | Admitting: Urology

## 2023-08-21 NOTE — Telephone Encounter (Signed)
LM for pt to call back when she is ready to schedule her sx with Dr. Lilian Kapur.

## 2023-08-22 NOTE — Progress Notes (Signed)
Subjective:  Patient ID: Joyce Lewis, female    DOB: 20-Sep-1960,  MRN: [id number]  Chief Complaint  Patient presents with   Foot Pain    Patient has painful bunions and a second hammertoe that is painful   Toe Pain    63 y.o. female presents with the above complaint. History confirmed with patient.  She has a painful bunion on her right foot as well as a painful second hammertoe with a large corn on the top of the toe.  Both are painful.  She has tried wider shoes pads offloading and none of these have been helpful for her.  Objective:  Physical Exam: warm, good capillary refill, no trophic changes or ulcerative lesions, normal DP and PT pulses, and normal sensory exam. Left Foot: normal exam, no swelling, tenderness, instability; ligaments intact, full range of motion of all ankle/foot joints Right Foot:  Hallux valgus deformity, pain on the medial eminence, good range of motion of the MTP joint dorsal second hammertoe corn at the PIPJ that is semireducible  No images are attached to the encounter.  Radiographs: Multiple views x-ray of the right foot: Previously taken right foot radiographs show hallux valgus deformity with underlying metatarsus adductus and deviation of the first second and third metatarsals, little to no arthritic changes within the first or second MTP, the second ray is elongated and has a hammertoe contracture Assessment:   1. Hallux valgus with bunions, right   2. Vitamin D deficiency   3. Hammer toe of right foot   4. Metatarsus adductus of right foot      Plan:  Patient was evaluated and treated and all questions answered.  Discussed the etiology and treatment including surgical and non surgical treatment for painful bunions and hammertoes.  She has exhausted all non surgical treatment prior to this visit including shoe gear changes and padding.  She desires surgical intervention. We discussed all risks including but not limited to: pain, swelling,  infection, scar, numbness which may be temporary or permanent, chronic pain, stiffness, nerve pain or damage, wound healing problems, bone healing problems including delayed or non-union and recurrence. Specifically we discussed the following procedures: Hallux valgus correction with Lapidus bunionectomy as well as metatarsus adductus correction with second and third TMT fusion with osteotomy to correct the deformity, bone graft from the heel second hammertoe correction and Akin osteotomy.  We discussed the rationale for each of these procedures considering her underlying deformity.  We also discussed the option of a simple hammertoe correction but she would prefer to have all her deformities and pain addressed together. Informed consent was signed today. Surgery will be scheduled at a mutually agreeable date. Information regarding this will be forwarded to our surgery scheduler. In the interim until surgery I recommended utilizing as wide of shoes as possible, take NSAIDs or tylenol as tolerated for pain, and a bunion padding shield which can be purchased online.  Vitamin D and calcium level has been ordered and she should complete this before surgery.  We discussed the recovery process and period of nonweightbearing for at least 1 month following surgery.  She should use a knee scooter for this.  She also did a bone density scan done at her endocrinologist and we will work on obtaining these records.   Surgical plan:  Procedure: -Right foot bunionectomy with Lapidus bunionectomy, Akin osteotomy, metatarsus adductus correction with midfoot fusion and osteotomy and second hammertoe correction bone graft from the heel  Location: -[facility]  Anesthesia plan: -General  With regional block  Postoperative pain plan: - Tylenol 1000 mg every 6 hours, ibuprofen 600 mg every 6 hours, gabapentin 300 mg every 8 hours x5 days, oxycodone 5 mg 1-2 tabs every 6 hours only as needed  DVT prophylaxis: -ASA 325 mg twice  daily  WB Restrictions / DME needs: -Nonweightbearing in splint postop    No follow-ups on file.

## 2023-09-02 ENCOUNTER — Encounter: Payer: Self-pay | Admitting: Podiatry

## 2023-09-18 ENCOUNTER — Ambulatory Visit: Payer: BC Managed Care – PPO

## 2023-10-07 ENCOUNTER — Ambulatory Visit
Admission: RE | Admit: 2023-10-07 | Discharge: 2023-10-07 | Disposition: A | Discharge status: Home / Self Care | Payer: BC Managed Care – PPO | Source: Ambulatory Visit | Attending: Family Medicine | Admitting: Family Medicine

## 2023-10-07 ENCOUNTER — Ambulatory Visit: Payer: BC Managed Care – PPO

## 2023-10-07 DIAGNOSIS — Z0000 Encounter for general adult medical examination without abnormal findings: Secondary | ICD-10-CM

## 2023-10-24 ENCOUNTER — Encounter: Payer: Self-pay | Admitting: Podiatry

## 2023-10-24 ENCOUNTER — Ambulatory Visit: Admitting: Podiatry

## 2023-10-24 VITALS — Ht 64.0 in | Wt 155.0 lb

## 2023-10-24 DIAGNOSIS — R2689 Other abnormalities of gait and mobility: Secondary | ICD-10-CM | POA: Diagnosis not present

## 2023-10-24 DIAGNOSIS — Q66221 Congenital metatarsus adductus, right foot: Secondary | ICD-10-CM

## 2023-10-24 DIAGNOSIS — M2011 Hallux valgus (acquired), right foot: Secondary | ICD-10-CM | POA: Diagnosis not present

## 2023-10-24 DIAGNOSIS — M2041 Other hammer toe(s) (acquired), right foot: Secondary | ICD-10-CM

## 2023-10-24 DIAGNOSIS — M21611 Bunion of right foot: Secondary | ICD-10-CM

## 2023-10-24 NOTE — Progress Notes (Signed)
  Subjective:  Patient ID: Joyce Lewis, female    DOB: 1961/06/11,  MRN: [id number]  Chief Complaint  Patient presents with   Surgery Scheduling    Pt is here to discuss surgery options.    63 y.o. female presents with the above complaint. History confirmed with patient.  She returns for follow-up on her bunion deformity has further questions before surgery.  Her husband is present today as well.  Objective:  Physical Exam: warm, good capillary refill, no trophic changes or ulcerative lesions, normal DP and PT pulses, and normal sensory exam. Left Foot: normal exam, no swelling, tenderness, instability; ligaments intact, full range of motion of all ankle/foot joints Right Foot:  Hallux valgus deformity, pain on the medial eminence, good range of motion of the MTP joint dorsal second hammertoe corn at the PIPJ that is semireducible  No images are attached to the encounter.  Radiographs: Multiple views x-ray of the right foot: Previously taken right foot radiographs show hallux valgus deformity with underlying metatarsus adductus and deviation of the first second and third metatarsals, little to no arthritic changes within the first or second MTP, the second ray is elongated and has a hammertoe contracture Assessment:   1. Hallux valgus with bunions, right   2. Metatarsus adductus of right foot   3. Hammer toe of right foot   4. Inability to bear weight       Plan:  Patient was evaluated and treated and all questions answered.  We again reviewed her surgical options as she has failed nonoperative treatment.  We discussed the proposed procedure with Lapidus bunionectomy Akin osteotomy correction of the metatarsal adductus with midfoot multilevel fusions with bone graft from the heel and second hammertoe correction.  We also discussed the alternative of second hammertoe correction with first MTP fusion.  We discussed the risks limitations and benefits of both routes.  She was concerned  about the recovery from the metatarsus adductus correction compared to the fusion I discussed with her as likely will only prolong this a few more weeks beyond what it would take an MTP fusion to heal.  I think at her age and functional level with good range of motion and without arthritic joint I would still recommend proceeding with this plan as opposed to fusion.  She agrees and still would like to proceed with this plan.  We discussed preoperative PT for nonweightbearing strategy including stair training.  Referral will be sent for a knee scooter as well.  I reviewed her bone density testing which shows adequate bone density.  She will still get her vitamin D and calcium level checked if she has not.      Return if symptoms worsen or fail to improve.

## 2023-10-24 NOTE — Patient Instructions (Signed)
Call to schedule physical therapy: [facility] [address]  [phone]  

## 2023-11-07 ENCOUNTER — Other Ambulatory Visit: Payer: Self-pay

## 2023-11-07 ENCOUNTER — Ambulatory Visit: Attending: Podiatry | Admitting: Physical Therapy

## 2023-11-07 ENCOUNTER — Encounter: Payer: Self-pay | Admitting: Physical Therapy

## 2023-11-07 VITALS — BP 117/62 | HR 89

## 2023-11-07 DIAGNOSIS — M79671 Pain in right foot: Secondary | ICD-10-CM | POA: Diagnosis present

## 2023-11-07 DIAGNOSIS — M2041 Other hammer toe(s) (acquired), right foot: Secondary | ICD-10-CM | POA: Insufficient documentation

## 2023-11-07 DIAGNOSIS — M21611 Bunion of right foot: Secondary | ICD-10-CM | POA: Insufficient documentation

## 2023-11-07 DIAGNOSIS — M2011 Hallux valgus (acquired), right foot: Secondary | ICD-10-CM | POA: Diagnosis not present

## 2023-11-07 DIAGNOSIS — R2689 Other abnormalities of gait and mobility: Secondary | ICD-10-CM | POA: Insufficient documentation

## 2023-11-07 DIAGNOSIS — Q66221 Congenital metatarsus adductus, right foot: Secondary | ICD-10-CM | POA: Diagnosis not present

## 2023-11-07 DIAGNOSIS — R269 Unspecified abnormalities of gait and mobility: Secondary | ICD-10-CM | POA: Diagnosis present

## 2023-11-07 DIAGNOSIS — R2681 Unsteadiness on feet: Secondary | ICD-10-CM | POA: Insufficient documentation

## 2023-11-07 NOTE — Therapy (Signed)
 OUTPATIENT PHYSICAL THERAPY NEURO EVALUATION   Patient Name: Joyce Lewis MRN: [id number] DOB:09-09-1960, 63 y.o., female Today's Date: 11/08/2023   PCP: Perley Bradley, Cherene Core  REFERRING PROVIDER: Floyce Hutching, DPM  END OF SESSION:  PT End of Session - 11/07/23 1529     Visit Number 1    Number of Visits 5    Date for PT Re-Evaluation 12/13/23    Authorization Type [employer]    PT Start Time 1528    PT Stop Time 1610    PT Time Calculation (min) 42 min    Equipment Utilized During Treatment Gait belt    Activity Tolerance Patient tolerated treatment well    Behavior During Therapy WFL for tasks assessed/performed             Past Medical History:  Diagnosis Date   ADHD (attention deficit hyperactivity disorder)    Arthritis    Broken wrist    GERD (gastroesophageal reflux disease)    Hemorrhoids    High triglycerides    Hypertension    Low serum vitamin D    Neuromuscular disorder (HCC)    No pertinent past medical history    Past Surgical History:  Procedure Laterality Date   ablation     HYSTEROSCOPY     KYPHOPLASTY N/A 05/23/2017   Procedure: KYPHOPLASTY L1;  Surgeon: Mort Ards, MD;  Location: [facility] OR;  Service: Orthopedics;  Laterality: N/A;  90 mins   RHINOPLASTY     SEPTOPLASTY     TONSILLECTOMY     Patient Active Problem List   Diagnosis Date Noted   Midline cystocele 02/20/2017   Mixed incontinence 02/20/2017   Hyperlipidemia 08/15/2015   Atypical chest pain 08/15/2015   Essential hypertension 08/15/2015   Irregular menses 01/16/2012   Menorrhagia 01/16/2012   Fibroids, submucosal 01/16/2012    ONSET DATE: 10/24/2023 (referral date)  REFERRING DIAG: M20.11,M21.611 (ICD-10-CM) - Hallux valgus with bunions, right Q66.221 (ICD-10-CM) - Metatarsus adductus of right foot M20.41 (ICD-10-CM) - Hammer toe of right foot R26.89 (ICD-10-CM) - Inability to bear weight  THERAPY DIAG:  Abnormality of gait and mobility - Plan: PT plan of care  cert/re-cert  Unsteadiness on feet - Plan: PT plan of care cert/re-cert  Pain in right foot - Plan: PT plan of care cert/re-cert  Rationale for Evaluation and Treatment: Rehabilitation  SUBJECTIVE:                                                                                                                                                                                             SUBJECTIVE STATEMENT: Patient reports that she is having surgery  in 6-8 weeks on the R side to fix some foot deformities (see below pertinent history for specific types) Patient was told to come to PT work on pre-op training for non-weightbearing preparation on RLE such as getting into home and recommendations for AD such as a knee scooter. Patient reports she will have her sister able to help her the fist week and her husband when he is not out of town. Patient has 1 flight of stairs to get into house. Patient is going to be living on the first level except she can only shower on the second level. Patient is wanting to avoid sponge bath if possible. She does live at home with husband but he may have to be out of town for part of the time but will have other family members who can help.  Pt accompanied by: self and drove self   PERTINENT HISTORY: per chart review "rigid hammertoe deformity with a dorsal clavus at the PIPJ second digit right foot mild tailor's bunion deformity and mild hallux valgus deformity"; Patient is scheduled for surgery June 20 and will be NWB following surgery on RLE     PAIN:  Are you having pain? No - only pain if R foot if wearing poor fitting shoe  PRECAUTIONS: None  RED FLAGS: None   WEIGHT BEARING RESTRICTIONS: No but will be non weightbearing s/p RLE following surgery in June  FALLS: Has patient fallen in last 6 months? No  LIVING ENVIRONMENT: Lives with: lives with their family - house/son Lives in: House/apartment Stairs: Yes: Internal: 1 flight steps; on left going up and  External: 1 steps; none Has following equipment at home:  bedside commode, shower chair, SPC  PLOF: Independent - worked full time [employer] as a Geophysicist/field seismologist   PATIENT GOALS: "Make this foot better and be able to get around."   OBJECTIVE:  Note: Objective measures were completed at Evaluation unless otherwise noted.  DIAGNOSTIC FINDINGS:  No major relevant imaging   COGNITION: Overall cognitive status: Within functional limits for tasks assessed   SENSATION: WFL  COORDINATION: WFL  EDEMA:  None   LOWER EXTREMITY ROM:     Grossly WFL   LOWER EXTREMITY MMT:    MMT Right Eval Left Eval  Hip flexion 4+/5 4+/5  Hip extension    Hip abduction 4+/5 4+/5  Hip adduction 4+/5 4+/5  Hip internal rotation    Hip external rotation    Knee flexion 4+/5 4+/5  Knee extension 4+/5 4+/5  Ankle dorsiflexion 4+/5 4+/5  Ankle plantarflexion    Ankle inversion    Ankle eversion    (Blank rows = not tested)  BED MOBILITY:  Independent   TRANSFERS:  With Two Feet: Sit to stand: Complete Independence  Assistive device utilized: None     Stand to sit: Complete Independence  Assistive device utilized: None      See below for Single Leg Education/Training   GAIT: Gait without trial of of single leg: complete independence; see below for training single leg options   STAIRS: To be assessed next session  TREATMENT :    TherAct:  Education on single leg training in preparation for preparation for NWB status on RLE for 6-8 weeks, independent with full WB status but requires assistance levels as noted below for NWB  GAIT: Gait pattern:  hop to and step to pattern Distance walked: ~10 feet Assistive device utilized: Environmental consultant - 2 wheeled Level of assistance: CGA Comments: Fatigues quickly and requests break after ~10 feet, is able to  maintain NWB status on RLE for this duration, PT educated and trained on setup with device and method before patient performed  TRANSFERS: PT demonstrated single leg stand and then had patient perform  Patient progressed from CGA to SBA with use of handrail on trail to come to stand, unable to perform without hand pushoff, setup walker handle and rolling bench that PT stabilized to mimic scooter setup for transfer from sit to stand and stand to sit, patient performed 4x during session with CGA (some mild instability initially but improved with reps)    STAIR: Discussed different methods to explore in next session including hop to with rail support, bump up on floor, and modified shower chair transfer up stairs PT expressed concern/feasibility of each option with patient and will plan to assess more next session   PATIENT EDUCATION: Education details: POC, goal collaboration, examination findings, nwb approaches to functional movement as noted above Person educated: Patient Education method: Explanation Education comprehension: verbalized understanding and needs further education  HOME EXERCISE PROGRAM: To follow up about shower chair and spouse coming to future session   GOALS: Goals reviewed with patient? Yes   LONG TERM GOALS: Target date: 12/13/2023  Patient will report demonstrate independence with final HEP in order to maintain current gains and continue to progress after physical therapy discharge.   Baseline: To be assessed  Goal status: INITIAL  2.  Patient will demonstrate ability to perform sit to stand transfer modI from chair while maintaining NWB status on RLE for improved healing outcomes s/p.  Baseline: CGA-SBA mixed  Goal status: INITIAL  3.  Patient will demonstrate ability to ambulate 30 feet while maintaining NWB status on RLE modI for improved healing outcomes s/p.  Baseline: 10 feet with CGA Goal status: INITIAL  4.  Patient with caregiver help will demonstrate  ability to ascend 1 x 4 steps with CGA while maintaining NWB status on RLE modI for improved healing outcomes s/p. Baseline: To be assessed  Goal status: INITIAL  ASSESSMENT:  CLINICAL IMPRESSION: Patient is a 63 y.o. female who was seen today for physical therapy evaluation and treatment for pre-op training for upcoming surgery to resolve "rigid hammertoe deformity with a dorsal clavus at the PIPJ second digit right foot mild tailor's bunion deformity and mild hallux valgus deformity." Patient independent at baseline with full weight bearing status; however, was mix of CGA-SBA with NWB trials performed today in preparation for WB status s/p (stairs to be assessed) and will benefit from further skilled physical therapy to progress modified independence with this method and caregiver training.   OBJECTIVE IMPAIRMENTS: decreased balance, difficulty walking, decreased strength, and pain.   ACTIVITY LIMITATIONS: sitting, standing, squatting, stairs, and locomotion level  PARTICIPATION LIMITATIONS: meal prep, cleaning, laundry, community activity, and occupation  PERSONAL FACTORS: Age, Time since onset of injury/illness/exacerbation, and 1 comorbidity: see above  are also affecting patient's functional outcome.   REHAB POTENTIAL: Good  CLINICAL DECISION MAKING: Stable/uncomplicated  EVALUATION COMPLEXITY: Low  PLAN:  PT FREQUENCY: 1x/week  PT DURATION: 4 weeks (only  2 scheduled on eval per patient request)   PLANNED INTERVENTIONS: 97164- PT Re-evaluation, 97750- Physical Performance Testing, 97110-Therapeutic exercises, 97530- Therapeutic activity, 97112- Neuromuscular re-education, 97535- Self Care, 16109- Manual therapy, and [id number]- Gait training  PLAN FOR NEXT SESSION: work on NWB function on RLE including stairs, gait, and transfers in preparation for surgery, recommendations for AD: shower chair, walker, scooter, transport chair, etc?   Coreen Devoid, PT, DPT 11/08/2023, 8:50  AM

## 2023-11-08 ENCOUNTER — Encounter: Payer: Self-pay | Admitting: Physical Therapy

## 2023-11-13 ENCOUNTER — Ambulatory Visit: Payer: Self-pay | Admitting: Physical Therapy

## 2023-11-13 ENCOUNTER — Encounter: Payer: Self-pay | Admitting: Physical Therapy

## 2023-11-13 VITALS — BP 124/77 | HR 93

## 2023-11-13 DIAGNOSIS — R269 Unspecified abnormalities of gait and mobility: Secondary | ICD-10-CM

## 2023-11-13 DIAGNOSIS — M79671 Pain in right foot: Secondary | ICD-10-CM

## 2023-11-13 DIAGNOSIS — R2681 Unsteadiness on feet: Secondary | ICD-10-CM

## 2023-11-13 NOTE — Therapy (Unsigned)
 OUTPATIENT PHYSICAL THERAPY NEURO TREATMENT   Patient Name: Joyce Lewis MRN: [id number] DOB:September 05, 1960, 63 y.o., female Today's Date: 11/14/2023   PCP: Perley Bradley, Cherene Core  REFERRING PROVIDER: Floyce Hutching, DPM  END OF SESSION:  PT End of Session - 11/13/23 1530     Visit Number 2    Number of Visits 5    Date for PT Re-Evaluation 12/13/23    Authorization Type [employer]    PT Start Time 1528    PT Stop Time 1613    PT Time Calculation (min) 45 min    Equipment Utilized During Treatment Gait belt    Activity Tolerance Patient tolerated treatment well    Behavior During Therapy WFL for tasks assessed/performed             Past Medical History:  Diagnosis Date   ADHD (attention deficit hyperactivity disorder)    Arthritis    Broken wrist    GERD (gastroesophageal reflux disease)    Hemorrhoids    High triglycerides    Hypertension    Low serum vitamin D    Neuromuscular disorder (HCC)    No pertinent past medical history    Past Surgical History:  Procedure Laterality Date   ablation     HYSTEROSCOPY     KYPHOPLASTY N/A 05/23/2017   Procedure: KYPHOPLASTY L1;  Surgeon: Mort Ards, MD;  Location: [facility] OR;  Service: Orthopedics;  Laterality: N/A;  90 mins   RHINOPLASTY     SEPTOPLASTY     TONSILLECTOMY     Patient Active Problem List   Diagnosis Date Noted   Midline cystocele 02/20/2017   Mixed incontinence 02/20/2017   Hyperlipidemia 08/15/2015   Atypical chest pain 08/15/2015   Essential hypertension 08/15/2015   Irregular menses 01/16/2012   Menorrhagia 01/16/2012   Fibroids, submucosal 01/16/2012    ONSET DATE: 10/24/2023 (referral date)  REFERRING DIAG: M20.11,M21.611 (ICD-10-CM) - Hallux valgus with bunions, right Q66.221 (ICD-10-CM) - Metatarsus adductus of right foot M20.41 (ICD-10-CM) - Hammer toe of right foot R26.89 (ICD-10-CM) - Inability to bear weight  THERAPY DIAG:  Abnormality of gait and mobility  Unsteadiness on feet  Pain in  right foot  Rationale for Evaluation and Treatment: Rehabilitation  SUBJECTIVE:                                                                                                                                                                                             SUBJECTIVE STATEMENT: Patient reports that she is doing well. Arrives with picture of shower chair. Patient denies falls and near falls.   Pt accompanied by: self and drove self  PERTINENT HISTORY: per chart review "rigid hammertoe deformity with a dorsal clavus at the PIPJ second digit right foot mild tailor's bunion deformity and mild hallux valgus deformity"; Patient is scheduled for surgery June 20 and will be NWB following surgery on RLE     PAIN:  Are you having pain? No   PRECAUTIONS: None  RED FLAGS: None   WEIGHT BEARING RESTRICTIONS: No but will be non weightbearing s/p RLE following surgery in June  FALLS: Has patient fallen in last 6 months? No  LIVING ENVIRONMENT: Lives with: lives with their family - house/son Lives in: House/apartment Stairs: Yes: Internal: 1 flight steps; on left going up and External: 1 steps; none Has following equipment at home: bedside commode, shower chair, SPC  PLOF: Independent - worked full time [employer] as a Geophysicist/field seismologist   PATIENT GOALS: "Make this foot better and be able to get around."   OBJECTIVE:  Note: Objective measures were completed at Evaluation unless otherwise noted.  DIAGNOSTIC FINDINGS:  No major relevant imaging   COGNITION: Overall cognitive status: Within functional limits for tasks assessed   SENSATION: WFL  COORDINATION: WFL  EDEMA:  None   LOWER EXTREMITY ROM:     Grossly WFL   LOWER EXTREMITY MMT:    MMT Right Eval Left Eval  Hip flexion 4+/5 4+/5  Hip extension    Hip abduction 4+/5 4+/5  Hip adduction 4+/5 4+/5  Hip internal rotation    Hip external rotation    Knee flexion 4+/5 4+/5  Knee extension  4+/5 4+/5  Ankle dorsiflexion 4+/5 4+/5  Ankle plantarflexion    Ankle inversion    Ankle eversion    (Blank rows = not tested)  BED MOBILITY:  Independent   TRANSFERS:  With Two Feet: Sit to stand: Complete Independence  Assistive device utilized: None     Stand to sit: Complete Independence  Assistive device utilized: None      See below for Single Leg Education/Training   GAIT: Gait without trial of of single leg: complete independence; see below for training single leg options   STAIRS: To be assessed next session                                                                                                                            TREATMENT :    Self Care: Vitals:   11/13/23 1536  BP: 124/77  Pulse: 93  Assessed seated on RUE; WFL for therapy Recommended spouse present for next session for caregiver training, patient verbalized understanding and plans to participate   TherAct:  Education and review on single leg training in preparation for preparation for NWB status on RLE for 6-8 weeks following upcoming surgery, independent with full WB status but requires assistance levels as noted below for NWB  GAIT: Gait pattern: hop to and step to pattern Distance walked: 10 feet Assistive device utilized: Walker - 2 wheeled Level of assistance: Modified independence Comments:  Fatigues quickly but appropriate and safe with turns today, no LOB, would advise use of 2WW following surgery  TRANSFERS: Single leg sit to stand with UE use x 5 times during session cueing on safe hand placement with walker (SBA to modI) added to HEP    STAIR: Discussed different methods verbally with PT demonstrating including bump up on floor and modified shower chair transfer up stairs; PT strongly advises against hop up method given number of stairs, difficulty of task and single rail, patient trialed with mat bump up on stairs but unable to get down and up with single LE use and only rail on  R (home setup), trialed 2 x 4 steps with shower chair and patient able to performs with setup assist with chair and SBA with pull to stand between steps, patient fatigues quickly but this is the most appropriate method Reviewed where caregiver would have to assist: SBA, pulling shower chair up, and placing walker at top/bottom of stairs Given challenges with methods today - PT strongly recommended against patient attempting to go up flight of stairs in first 6-8 weeks for showering, PT advised sponge bath and baby wipes for bathing on lower level - patient initially resistant but after trialing stairs and understanding will likely not even being able to get surgical leg weight verbalized understanding  Entrance into home: Patient with single step into homes without rails and not deep enough for walker, patient requests to trial single leg hop with UE assist (performed on curb to simulate in session)- patient unable to peform with precautions requiring modA and hand touch/use of bilateral LE to prevent fall, PT recommended use of shower chair method instead with patient able to perform and verbalized understanding that this would be most appropriate    PATIENT EDUCATION: Education details: Initial HEP + above Person educated: Patient Education method: Explanation Education comprehension: verbalized understanding and needs further education  HOME EXERCISE PROGRAM: Access Code: [email or web address] URL: [email or web address] Date: 11/13/2023 Prepared by: Camella Cave  Exercises - Single Leg Sit to Stand with Arm Support  - 1 x daily - 7 x weekly - 3 sets - 3-5 reps  GOALS: Goals reviewed with patient? Yes   LONG TERM GOALS: Target date: 12/13/2023  Patient will report demonstrate independence with final HEP in order to maintain current gains and continue to progress after physical therapy discharge.   Baseline: To be assessed  Goal status: INITIAL  2.  Patient will demonstrate ability to  perform sit to stand transfer modI from chair while maintaining NWB status on RLE for improved healing outcomes s/p.  Baseline: CGA-SBA mixed  Goal status: INITIAL  3.  Patient will demonstrate ability to ambulate 30 feet while maintaining NWB status on RLE modI for improved healing outcomes s/p.  Baseline: 10 feet with CGA Goal status: INITIAL  4.  Patient with caregiver help will demonstrate ability to ascend 1 x 4 steps with CGA while maintaining NWB status on RLE modI for improved healing outcomes s/p. Baseline: To be assessed  Goal status: INITIAL  ASSESSMENT:  CLINICAL IMPRESSION: Skilled PT session emphasized review of NWB status on RLE following post-op protocol. Patient modI with transfers to stand and gait over short distances; recommend patient not attempt going to second level of home and if to perform with shower chair method with minA from caregiver. Anticipate caregiver training and D/C next session.   OBJECTIVE IMPAIRMENTS: decreased balance, difficulty walking, decreased strength, and pain.   ACTIVITY LIMITATIONS: sitting, standing, squatting, stairs, and  locomotion level  PARTICIPATION LIMITATIONS: meal prep, cleaning, laundry, community activity, and occupation  PERSONAL FACTORS: Age, Time since onset of injury/illness/exacerbation, and 1 comorbidity: see above are also affecting patient's functional outcome.   REHAB POTENTIAL: Good  CLINICAL DECISION MAKING: Stable/uncomplicated  EVALUATION COMPLEXITY: Low  PLAN:  PT FREQUENCY: 1x/week  PT DURATION: 4 weeks (only 2 scheduled on eval per patient request)   PLANNED INTERVENTIONS: 78295- PT Re-evaluation, 97750- Physical Performance Testing, 97110-Therapeutic exercises, 97530- Therapeutic activity, [id number]- Neuromuscular re-education, 97535- Self Care, 62130- Manual therapy, and 97116- Gait training  PLAN FOR NEXT SESSION: train caregiver with transfers especially shower chair entrance to home    Coreen Devoid, PT, DPT 11/14/2023, 1:40 PM

## 2023-11-14 ENCOUNTER — Encounter: Payer: Self-pay | Admitting: Physical Therapy

## 2023-11-19 ENCOUNTER — Telehealth: Payer: Self-pay | Admitting: Podiatry

## 2023-11-19 NOTE — Telephone Encounter (Signed)
 DOS: 12/27/23  LAPIDUS PROCEDURE INCLUDING BUNIONECTOMY-28297  Arthrodesis, midtarsal or tarsometatarsal, multiple or transverse.-82956   removal of bone for grafting purposes.-20900     EFFECTIVE DATE :  07/10/23  DEDUCTIBLE : $1,250.00  REMAINING:   $1,250.00  OOP:   $4,890.00  REMAINING:   $4,573.34  COINSURANCE: 20%      PER ELAINE A OF [employer] NO PRIOR AUTH IS REQ FOR CPT CODES 28297,28735,20900  REF# [id number]

## 2023-11-20 ENCOUNTER — Ambulatory Visit: Payer: Self-pay | Admitting: Physical Therapy

## 2023-11-20 ENCOUNTER — Encounter: Payer: Self-pay | Admitting: Physical Therapy

## 2023-11-20 DIAGNOSIS — M79671 Pain in right foot: Secondary | ICD-10-CM

## 2023-11-20 DIAGNOSIS — R269 Unspecified abnormalities of gait and mobility: Secondary | ICD-10-CM | POA: Diagnosis not present

## 2023-11-20 DIAGNOSIS — R2681 Unsteadiness on feet: Secondary | ICD-10-CM

## 2023-11-20 NOTE — Therapy (Signed)
 OUTPATIENT PHYSICAL THERAPY NEURO TREATMENT / DISCHARGE   Patient Name: Joyce Lewis MRN: [id number] DOB:01-May-1961, 63 y.o., female Today's Date: 11/20/2023   PCP: Perley Bradley, Cherene Core  REFERRING PROVIDER: Floyce Hutching, DPM  PHYSICAL THERAPY DISCHARGE SUMMARY  Visits from Start of Care: 3  Current functional level related to goals / functional outcomes: Achieved 3/4 LTG   Remaining deficits: Will require caregiver assistance as noted below   Education / Equipment: See below   Patient agrees to discharge. Patient goals were partially met. Patient is being discharged due to being pleased with the current functional level.   END OF SESSION:  PT End of Session - 11/20/23 1533     Visit Number 3    Number of Visits 5    Date for PT Re-Evaluation 12/13/23    Authorization Type [employer]    PT Start Time 1531    PT Stop Time 1603    PT Time Calculation (min) 32 min    Equipment Utilized During Treatment Gait belt    Activity Tolerance Patient tolerated treatment well    Behavior During Therapy WFL for tasks assessed/performed             Past Medical History:  Diagnosis Date   ADHD (attention deficit hyperactivity disorder)    Arthritis    Broken wrist    GERD (gastroesophageal reflux disease)    Hemorrhoids    High triglycerides    Hypertension    Low serum vitamin D    Neuromuscular disorder (HCC)    No pertinent past medical history    Past Surgical History:  Procedure Laterality Date   ablation     HYSTEROSCOPY     KYPHOPLASTY N/A 05/23/2017   Procedure: KYPHOPLASTY L1;  Surgeon: Mort Ards, MD;  Location: [facility] OR;  Service: Orthopedics;  Laterality: N/A;  90 mins   RHINOPLASTY     SEPTOPLASTY     TONSILLECTOMY     Patient Active Problem List   Diagnosis Date Noted   Midline cystocele 02/20/2017   Mixed incontinence 02/20/2017   Hyperlipidemia 08/15/2015   Atypical chest pain 08/15/2015   Essential hypertension 08/15/2015   Irregular menses  01/16/2012   Menorrhagia 01/16/2012   Fibroids, submucosal 01/16/2012    ONSET DATE: 10/24/2023 (referral date)  REFERRING DIAG: M20.11,M21.611 (ICD-10-CM) - Hallux valgus with bunions, right Q66.221 (ICD-10-CM) - Metatarsus adductus of right foot M20.41 (ICD-10-CM) - Hammer toe of right foot R26.89 (ICD-10-CM) - Inability to bear weight  THERAPY DIAG:  Abnormality of gait and mobility  Unsteadiness on feet  Pain in right foot  Rationale for Evaluation and Treatment: Rehabilitation  SUBJECTIVE:  SUBJECTIVE STATEMENT: Patient reports that she is doing well. Arrives with picture of shower chair. Patient denies falls and near falls.   Pt accompanied by: self and drove self   PERTINENT HISTORY: per chart review "rigid hammertoe deformity with a dorsal clavus at the PIPJ second digit right foot mild tailor's bunion deformity and mild hallux valgus deformity"; Patient is scheduled for surgery June 20 and will be NWB following surgery on RLE     PAIN:  Are you having pain? No   PRECAUTIONS: None  RED FLAGS: None   WEIGHT BEARING RESTRICTIONS: No but will be non weightbearing s/p RLE following surgery in June  FALLS: Has patient fallen in last 6 months? No  LIVING ENVIRONMENT: Lives with: lives with their family - house/son Lives in: House/apartment Stairs: Yes: Internal: 1 flight steps; on left going up and External: 1 steps; none Has following equipment at home: bedside commode, shower chair, SPC  PLOF: Independent - worked full time [employer] as a Geophysicist/field seismologist   PATIENT GOALS: "Make this foot better and be able to get around."   OBJECTIVE:  Note: Objective measures were completed at Evaluation unless otherwise noted.  DIAGNOSTIC FINDINGS:  No major relevant imaging    COGNITION: Overall cognitive status: Within functional limits for tasks assessed   SENSATION: WFL  COORDINATION: WFL  EDEMA:  None   LOWER EXTREMITY ROM:     Grossly WFL   LOWER EXTREMITY MMT:    MMT Right Eval Left Eval  Hip flexion 4+/5 4+/5  Hip extension    Hip abduction 4+/5 4+/5  Hip adduction 4+/5 4+/5  Hip internal rotation    Hip external rotation    Knee flexion 4+/5 4+/5  Knee extension 4+/5 4+/5  Ankle dorsiflexion 4+/5 4+/5  Ankle plantarflexion    Ankle inversion    Ankle eversion    (Blank rows = not tested)  BED MOBILITY:  Independent   TRANSFERS:  With Two Feet: Sit to stand: Complete Independence  Assistive device utilized: None     Stand to sit: Complete Independence  Assistive device utilized: None      See below for Single Leg Education/Training   GAIT: Gait without trial of of single leg: complete independence; see below for training single leg options   STAIRS: To be assessed next session                                                                                                                            TREATMENT :    TherAct:  Education and review on single leg training with patient and caregiver in preparation for preparation for NWB status on RLE for 6-8 weeks following upcoming surgery, independent with full WB status but requires assistance levels as noted below for NWB  GAIT: Gait pattern: hop to and step to pattern Distance walked: 30 feet Assistive device utilized: Walker - 2 wheeled Level of assistance: SBA Comments: Cues for not picking  up walker but demonstrates appropriate awareness by end of session   TRANSFERS: Single leg sit to stand with UE use x 6 times during session cueing on safe hand placement with walker (SBA to modI with one instance of CGA due to instability) added to HEP    STAIR: Reviewed again with patient why bump transfer method up stairs ws not appropriate for patient as unable to get to  ground with single leg precautions  Entrance into home: Patient with single step into homes without rails and not deep enough for walker, patient informs PT today that no wall present to stabilize, attempt at curb step with minA from PT to with shower chair method but unable to stabilize without breaking precautions and being at a high falls risk, discuss use of wheelchair with patient spouse reporting able to put in ramp entrance, discuss ADA recommendations with 1" height gain with 12" runway and patient spouse stating will be able to make appropriate  Self Care:  Discuss needing a caregiver present at all times as home access will require second person and for things like carrying a pot or cooking will require second person, discussed recommend AD including 2WW, knee scooter, bedside commode, and transport chair all of which patient has available or can borrow  PATIENT EDUCATION: Education details: Initial HEP + above Person educated: Patient Education method: Explanation Education comprehension: verbalized understanding and needs further education  HOME EXERCISE PROGRAM: Access Code: [email or web address] URL: [email or web address] Date: 11/13/2023 Prepared by: Camella Cave  Exercises - Single Leg Sit to Stand with Arm Support  - 1 x daily - 7 x weekly - 3 sets - 3-5 reps  GOALS: Goals reviewed with patient? Yes   LONG TERM GOALS: Target date: 12/13/2023  Patient will report demonstrate independence with final HEP in order to maintain current gains and continue to progress after physical therapy discharge.   Baseline: To be assessed, independent with HEP Goal status: MET  2.  Patient will demonstrate ability to perform sit to stand transfer modI from chair while maintaining NWB status on RLE for improved healing outcomes s/p.  Baseline: CGA-SBA mixed, modI by end of session Goal status: MET  3.  Patient will demonstrate ability to ambulate 30 feet while maintaining NWB status on  RLE modI for improved healing outcomes s/p.  Baseline: 10 feet with CGA, 30 feet with SBA Goal status: MET  4.  Patient with caregiver help will demonstrate ability to ascend 1 x 4 steps with CGA while maintaining NWB status on RLE modI for improved healing outcomes s/p. Baseline: To be assessed, not met - unable to perform with home setup  Goal status: NOT MET - patient husband to put in a ramp   ASSESSMENT:  CLINICAL IMPRESSION: Patient is discharging from skilled PT demonstrating ability with assistance of caregiver to navigate NWB precautions. Will require ramped entrance to home or other arrangements which spouse reports is able to take care of. Also recommend 24 hour caregiver access for safety while NWB precautions for RLE in place. Patient and caregiver agreeable to plan.   OBJECTIVE IMPAIRMENTS: decreased balance, difficulty walking, decreased strength, and pain.   ACTIVITY LIMITATIONS: sitting, standing, squatting, stairs, and locomotion level  PARTICIPATION LIMITATIONS: meal prep, cleaning, laundry, community activity, and occupation  PERSONAL FACTORS: Age, Time since onset of injury/illness/exacerbation, and 1 comorbidity: see above are also affecting patient's functional outcome.   REHAB POTENTIAL: Good  CLINICAL DECISION MAKING: Stable/uncomplicated  EVALUATION COMPLEXITY: Low  PLAN:  PT FREQUENCY: 1x/week  PT DURATION: 4 weeks (only 2 scheduled on eval per patient request)   PLANNED INTERVENTIONS: 97164- PT Re-evaluation, 97750- Physical Performance Testing, 97110-Therapeutic exercises, 97530- Therapeutic activity, [id number]- Neuromuscular re-education, 97535- Self Care, 29562- Manual therapy, and [id number]- Gait training  PLAN FOR NEXT SESSION: NA - D/C with final HEP  Coreen Devoid, PT, DPT 11/20/2023, 4:22 PM

## 2023-12-05 ENCOUNTER — Encounter: Payer: Self-pay | Admitting: Podiatry

## 2023-12-05 ENCOUNTER — Inpatient Hospital Stay
Admission: RE | Admit: 2023-12-05 | Discharge: 2023-12-05 | Disposition: A | Discharge status: Home / Self Care | Payer: Self-pay | Source: Ambulatory Visit

## 2023-12-05 NOTE — Patient Instructions (Signed)
 Your procedure is scheduled on: Friday,June 20th 2025  Report to the Registration Desk on the 1st floor of the [facility] To find out your arrival time, please call [phone] between 1PM - 3PM on: If your arrival time is 6:00 am, do not arrive before that time as the [facility] entrance doors do not open until 6:00 am.  REMEMBER: Instructions that are not followed completely may result in serious medical risk, up to and including death; or upon the discretion of your surgeon and anesthesiologist your surgery may need to be rescheduled.  Do not eat food after midnight the night before surgery.  No gum chewing or hard candies.  You may however, drink CLEAR liquids up to 2 hours before you are scheduled to arrive for your surgery. Do not drink anything within 2 hours of your scheduled arrival time.  Clear liquids include: - water  - apple juice without pulp - gatorade (not RED colors) - black coffee or tea (Do NOT add milk or creamers to the coffee or tea) Do NOT drink anything that is not on this list.  Do not take metformin two days before surgery  In addition, your doctor has ordered for you to drink the provided:   Gatorade G2 Drinking this carbohydrate drink up to two hours before surgery helps to reduce insulin resistance and improve patient outcomes. Please complete drinking 2 hours before scheduled arrival time.  One week prior to surgery: Stop Anti-inflammatories (NSAIDS) such as Advil , Aleve, Ibuprofen , Motrin , Naproxen, Naprosyn and Aspirin based products such as Excedrin, Goody's Powder, BC Powder. Stop ANY OVER THE COUNTER supplements until after surgery.  You may however, continue to take Tylenol  if needed for pain up until the day of surgery.   Continue taking all of your other prescription medications up until the day of surgery.  ON THE DAY OF SURGERY ONLY TAKE THESE MEDICATIONS WITH SIPS OF WATER:       No Alcohol for 24 hours before or after  surgery.  No Smoking including e-cigarettes for 24 hours before surgery.  No chewable tobacco products for at least 6 hours before surgery.  No nicotine patches on the day of surgery.  Do not use any "recreational" drugs for at least a week (preferably 2 weeks) before your surgery.  Please be advised that the combination of cocaine and anesthesia may have negative outcomes, up to and including death. If you test positive for cocaine, your surgery will be cancelled.  On the morning of surgery brush your teeth with toothpaste and water, you may rinse your mouth with mouthwash if you wish. Do not swallow any toothpaste or mouthwash.  Use CHG Soap or wipes as directed on instruction sheet.  Do not wear jewelry, make-up, hairpins, clips or nail polish.  For welded (permanent) jewelry: bracelets, anklets, waist bands, etc.  Please have this removed prior to surgery.  If it is not removed, there is a chance that hospital personnel will need to cut it off on the day of surgery.  Do not wear lotions, powders, or perfumes.   Do not shave body hair from the neck down 48 hours before surgery.  Contact lenses, hearing aids and dentures may not be worn into surgery.  Do not bring valuables to the hospital. [facility] is not responsible for any missing/lost belongings or valuables.    Bring your C-PAP to the hospital in case you may have to spend the night.   Notify your doctor if there is  any change in your medical condition (cold, fever, infection).  Wear comfortable clothing (specific to your surgery type) to the hospital.  After surgery, you can help prevent lung complications by doing breathing exercises.  Take deep breaths and cough every 1-2 hours. Your doctor may order a device called an Incentive Spirometer to help you take deep breaths. When coughing or sneezing, hold a pillow firmly against your incision with both hands. This is called "splinting." Doing this helps protect your  incision. It also decreases belly discomfort.  If you are being admitted to the hospital overnight, leave your suitcase in the car. After surgery it may be brought to your room.  If you are being discharged the day of surgery, you will not be allowed to drive home. You will need a responsible individual to drive you home and stay with you for 24 hours after surgery.   If you are taking public transportation, you will need to have a responsible individual with you.  Please call the Pre-admissions Testing Dept. at [phone] if you have any questions about these instructions.  Surgery Visitation Policy:  Patients having surgery or a procedure may have two visitors.  Children under the age of 4 must have an adult with them who is not the patient.  Inpatient Visitation:    Visiting hours are 7 a.m. to 8 p.m. Up to four visitors are allowed at one time in a patient room. The visitors may rotate out with other people during the day.  One visitor age 57 or older may stay with the patient overnight and must be in the room by 8 p.m.     Preparing for Surgery with CHLORHEXIDINE  GLUCONATE (CHG) Soap  Chlorhexidine  Gluconate (CHG) Soap  o An antiseptic cleaner that kills germs and bonds with the skin to continue killing germs even after washing  o Used for showering the night before surgery and morning of surgery  Before surgery, you can play an important role by reducing the number of germs on your skin.  CHG (Chlorhexidine  gluconate) soap is an antiseptic cleanser which kills germs and bonds with the skin to continue killing germs even after washing.  Please do not use if you have an allergy to CHG or antibacterial soaps. If your skin becomes reddened/irritated stop using the CHG.  1. Shower the NIGHT BEFORE SURGERY and the MORNING OF SURGERY with CHG soap.  2. If you choose to wash your hair, wash your hair first as usual with your normal shampoo.  3. After shampooing, rinse  your hair and body thoroughly to remove the shampoo.  4. Use CHG as you would any other liquid soap. You can apply CHG directly to the skin and wash gently with a scrungie or a clean washcloth.  5. Apply the CHG soap to your body only from the neck down. Do not use on open wounds or open sores. Avoid contact with your eyes, ears, mouth, and genitals (private parts). Wash face and genitals (private parts) with your normal soap.  6. Wash thoroughly, paying special attention to the area where your surgery will be performed.  7. Thoroughly rinse your body with warm water.  8. Do not shower/wash with your normal soap after using and rinsing off the CHG soap.  9. Pat yourself dry with a clean towel.  10. Wear clean pajamas to bed the night before surgery.  12. Place clean sheets on your bed the night of your first shower and do not sleep with pets.  13. Shower again with the CHG soap on the day of surgery prior to arriving at the hospital.  14. Do not apply any deodorants/lotions/powders.  15. Please wear clean clothes to the hospital.

## 2023-12-09 ENCOUNTER — Telehealth: Payer: Self-pay | Admitting: Podiatry

## 2023-12-09 NOTE — Telephone Encounter (Signed)
 Received several calls from pt she was at the pcp office and they did not receive H & P form for them to fill out.   She gave me fax number [phone].  Also received vm from pcp office.  I have faxed it 2 times this morning and got 2 confirmations.

## 2023-12-19 ENCOUNTER — Telehealth: Payer: Self-pay | Admitting: Podiatry

## 2023-12-19 ENCOUNTER — Inpatient Hospital Stay: Admission: RE | Admit: 2023-12-19 | Payer: Self-pay | Source: Ambulatory Visit

## 2023-12-19 NOTE — Telephone Encounter (Signed)
 Pt called and canceled her surgery for 12/27/23 at [facility] due to family emergency. Her father is having emergency surgery and lives out of town and she is having to go there. She maybe gone a few months but is going to call to r/s as soon as she can.   I notified centralized scheduling at [facility] hospital.

## 2023-12-27 ENCOUNTER — Encounter: Admission: RE | Payer: Self-pay | Source: Home / Self Care

## 2023-12-27 ENCOUNTER — Ambulatory Visit: Admission: RE | Admit: 2023-12-27 | Payer: Self-pay | Source: Home / Self Care | Admitting: Podiatry

## 2023-12-27 HISTORY — DX: Bunion of unspecified foot: M21.619

## 2023-12-27 SURGERY — BUNIONECTOMY, LAPIDUS
Anesthesia: General | Laterality: Right

## 2024-01-02 ENCOUNTER — Encounter: Payer: 59 | Admitting: Podiatry

## 2024-01-16 ENCOUNTER — Encounter: Payer: 59 | Admitting: Podiatry

## 2024-02-06 ENCOUNTER — Encounter: Payer: 59 | Admitting: Podiatry

## 2024-03-03 IMAGING — DX DG SHOULDER 2+V*R*
3 series · 3 of 3 positions shown · non-contrast
Comparison: None Available.

CLINICAL DATA: Fall, right shoulder pain

EXAM:
RIGHT SHOULDER - 2+ VIEW

[shoulder y view]
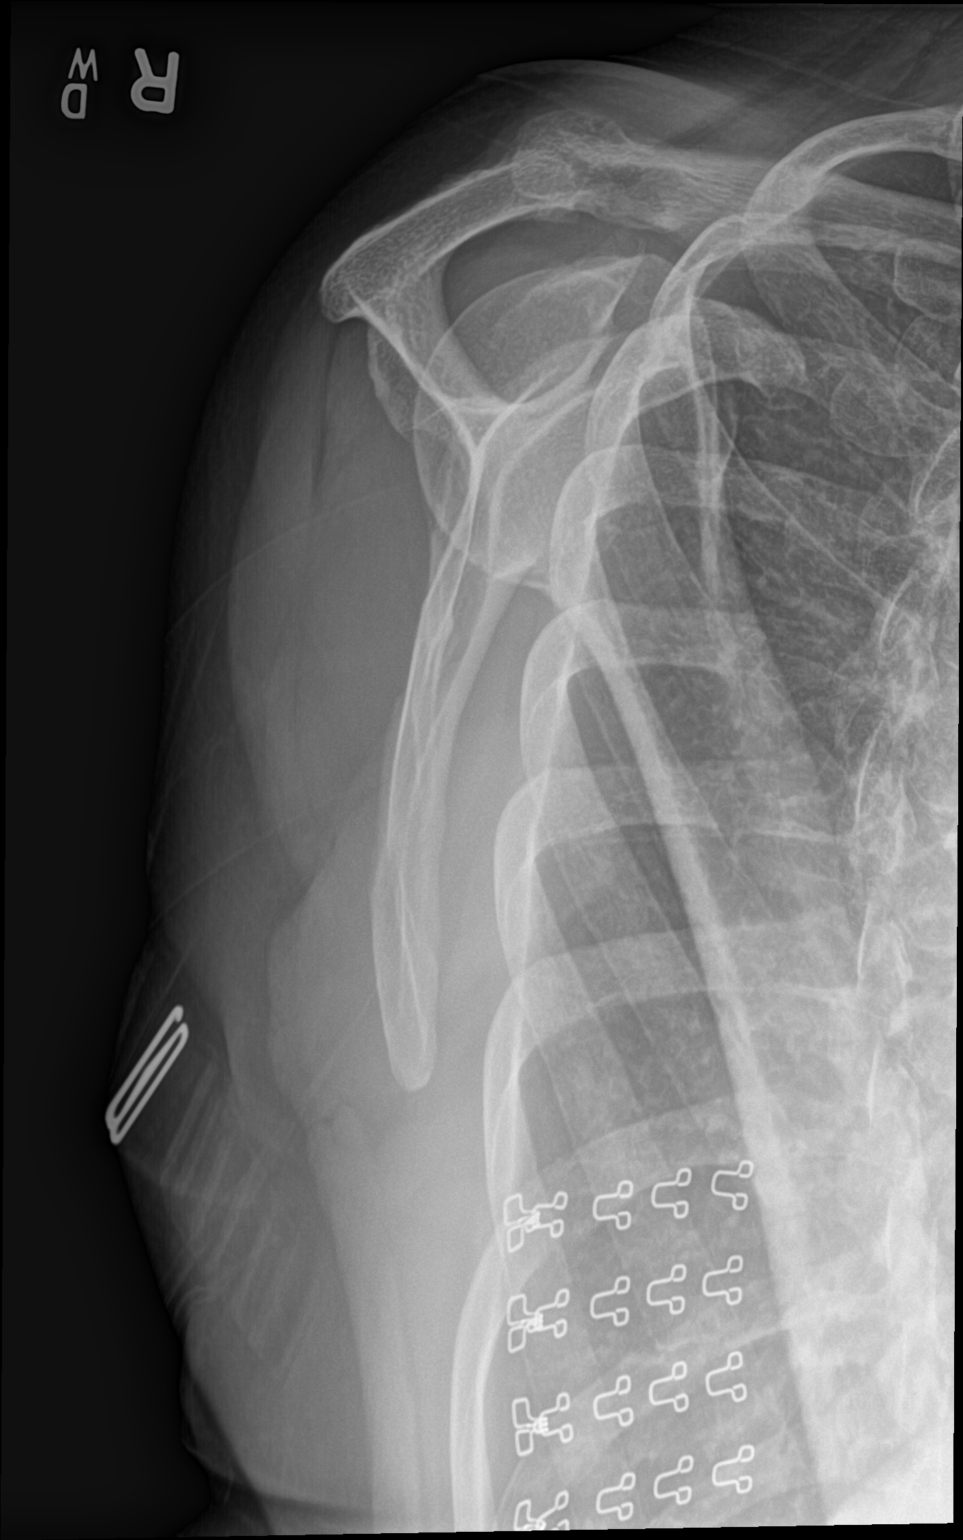

[shoulder ap]
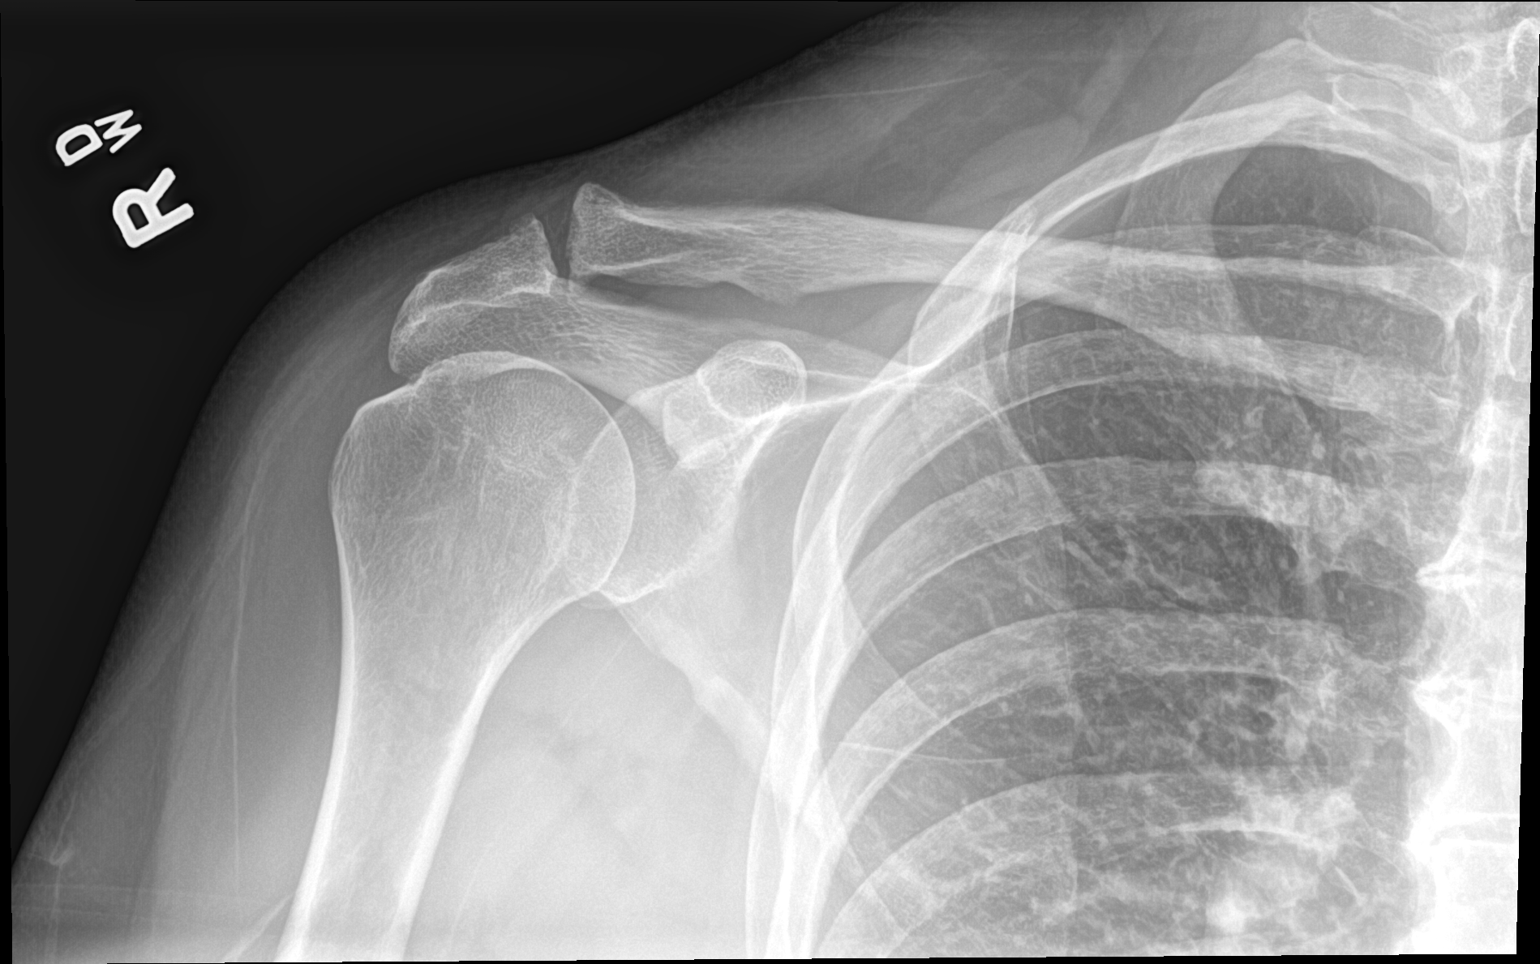

[shoulder grashey]
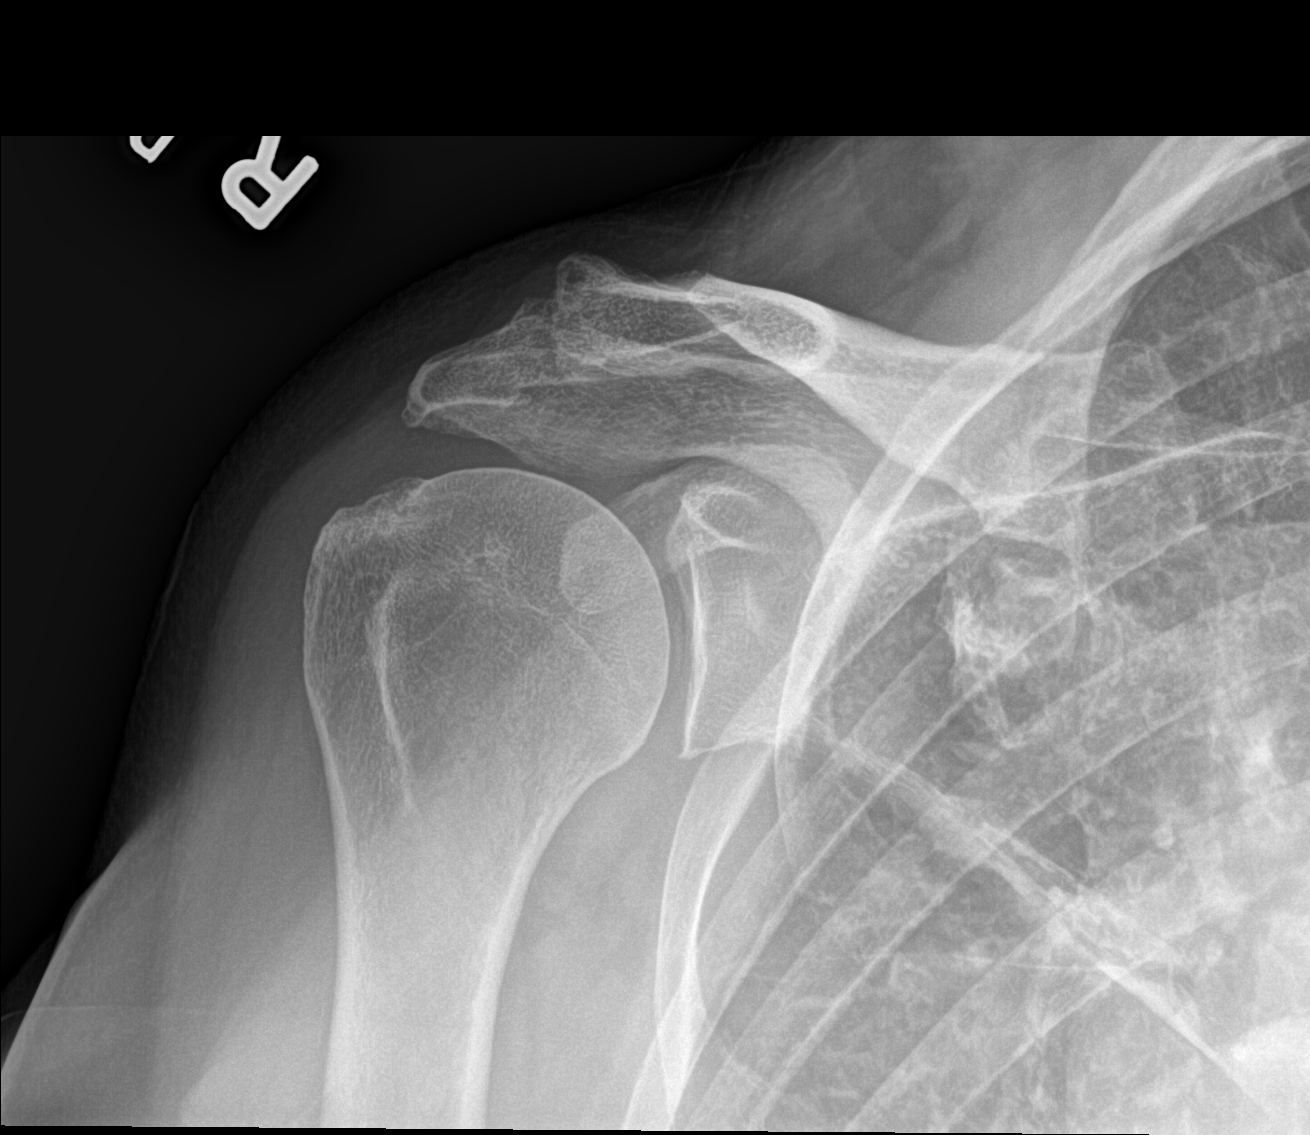

[3 of 3 positions shown; findings below may reference images not displayed]

FINDINGS: Normal alignment. No acute fracture or dislocation. Mild
acromioclavicular and glenohumeral degenerative arthritis. Limited
evaluation of the right hemithorax is unremarkable.
IMPRESSION: Mild acromioclavicular and glenohumeral degenerative arthritis.

## 2024-05-12 ENCOUNTER — Other Ambulatory Visit (HOSPITAL_BASED_OUTPATIENT_CLINIC_OR_DEPARTMENT_OTHER): Payer: Self-pay | Admitting: Family Medicine

## 2024-05-12 DIAGNOSIS — E782 Mixed hyperlipidemia: Secondary | ICD-10-CM

## 2024-05-19 ENCOUNTER — Ambulatory Visit (HOSPITAL_BASED_OUTPATIENT_CLINIC_OR_DEPARTMENT_OTHER)
Admission: RE | Admit: 2024-05-19 | Discharge: 2024-05-19 | Disposition: A | Discharge status: Home / Self Care | Payer: Self-pay | Source: Ambulatory Visit | Attending: Family Medicine | Admitting: Family Medicine

## 2024-05-19 DIAGNOSIS — E782 Mixed hyperlipidemia: Secondary | ICD-10-CM | POA: Insufficient documentation

## 2024-06-30 ENCOUNTER — Encounter: Payer: Self-pay | Admitting: *Deleted
